# Patient Record
Sex: Female | Born: 1950
Health system: Southern US, Community
[De-identification: ages and names within clinical notes are randomized; demographics above are authoritative.]

## PROBLEM LIST (undated history)

## (undated) DIAGNOSIS — M1990 Unspecified osteoarthritis, unspecified site: Secondary | ICD-10-CM

## (undated) DIAGNOSIS — R011 Cardiac murmur, unspecified: Secondary | ICD-10-CM

## (undated) DIAGNOSIS — N200 Calculus of kidney: Secondary | ICD-10-CM

## (undated) DIAGNOSIS — I514 Myocarditis, unspecified: Secondary | ICD-10-CM

## (undated) DIAGNOSIS — K5790 Diverticulosis of intestine, part unspecified, without perforation or abscess without bleeding: Secondary | ICD-10-CM

## (undated) DIAGNOSIS — G4730 Sleep apnea, unspecified: Secondary | ICD-10-CM

## (undated) DIAGNOSIS — H269 Unspecified cataract: Secondary | ICD-10-CM

## (undated) DIAGNOSIS — Z9989 Dependence on other enabling machines and devices: Secondary | ICD-10-CM

## (undated) DIAGNOSIS — S42402A Unspecified fracture of lower end of left humerus, initial encounter for closed fracture: Secondary | ICD-10-CM

## (undated) DIAGNOSIS — I10 Essential (primary) hypertension: Secondary | ICD-10-CM

## (undated) DIAGNOSIS — R112 Nausea with vomiting, unspecified: Secondary | ICD-10-CM

## (undated) DIAGNOSIS — E119 Type 2 diabetes mellitus without complications: Secondary | ICD-10-CM

## (undated) DIAGNOSIS — G4733 Obstructive sleep apnea (adult) (pediatric): Secondary | ICD-10-CM

## (undated) DIAGNOSIS — Z9889 Other specified postprocedural states: Secondary | ICD-10-CM

## (undated) DIAGNOSIS — H3530 Unspecified macular degeneration: Secondary | ICD-10-CM

## (undated) DIAGNOSIS — E7800 Pure hypercholesterolemia, unspecified: Secondary | ICD-10-CM

## (undated) HISTORY — PX: BREAST LUMPECTOMY WITH AXILLARY LYMPH NODE BIOPSY: SHX5593

## (undated) HISTORY — PX: EXTRACORPOREAL SHOCK WAVE LITHOTRIPSY: SHX1557

## (undated) HISTORY — PX: CERVICAL POLYPECTOMY: SHX88

## (undated) HISTORY — PX: TONSILLECTOMY: SUR1361

## (undated) HISTORY — PX: EYE SURGERY: SHX253

## (undated) HISTORY — PX: JOINT REPLACEMENT: SHX530

---

## 2006-07-17 ENCOUNTER — Ambulatory Visit: Payer: Self-pay | Admitting: Gastroenterology

## 2006-08-01 ENCOUNTER — Encounter (INDEPENDENT_AMBULATORY_CARE_PROVIDER_SITE_OTHER): Payer: Self-pay | Admitting: Family Medicine

## 2006-08-01 ENCOUNTER — Encounter: Payer: Self-pay | Admitting: Gastroenterology

## 2006-08-01 ENCOUNTER — Ambulatory Visit: Payer: Self-pay | Admitting: Gastroenterology

## 2009-08-10 ENCOUNTER — Ambulatory Visit: Payer: Self-pay | Admitting: Gastroenterology

## 2009-08-10 DIAGNOSIS — K6289 Other specified diseases of anus and rectum: Secondary | ICD-10-CM

## 2009-08-10 DIAGNOSIS — K5730 Diverticulosis of large intestine without perforation or abscess without bleeding: Secondary | ICD-10-CM | POA: Insufficient documentation

## 2009-08-10 DIAGNOSIS — Z860100 Personal history of colon polyps, unspecified: Secondary | ICD-10-CM | POA: Insufficient documentation

## 2009-08-10 DIAGNOSIS — R1011 Right upper quadrant pain: Secondary | ICD-10-CM

## 2010-02-03 NOTE — Assessment & Plan Note (Signed)
Summary: rectal pain--ch.   History of Present Illness Visit Type: Initial Visit Primary GI MD: Elie Goody MD [facility] Primary Lynnmarie Lovett: Malena Peer, MD Chief Complaint: C/o lump in rt. side and in irritation around rectum History of Present Illness:   This is a 60 year old female, who relates rectal swelling and rectal pain for several weeks, which has now resolved. She also noted right flank pain that changed with position and has resolved after physical therapy for low back pain.  She states she recently had diverticulitis and was treated with a course of antibiotics. Colonoscopy in July 2008 revealed diverticulosis and a small adenomatous polyp.   GI Review of Systems    Reports abdominal pain and  bloating.      Denies acid reflux, belching, chest pain, dysphagia with liquids, dysphagia with solids, heartburn, loss of appetite, nausea, vomiting, vomiting blood, weight loss, and  weight gain.      Reports diverticulosis.     Denies anal fissure, black tarry stools, change in bowel habit, constipation, diarrhea, fecal incontinence, heme positive stool, hemorrhoids, irritable bowel syndrome, jaundice, light color stool, liver problems, rectal bleeding, and  rectal pain.   Current Medications (verified): 1)  Ambien 10 Mg Tabs (Zolpidem Tartrate) .Marland Kitchen.. 1 By Mouth At Bedtime As Needed Sleep 2)  Multivitamins  Tabs (Multiple Vitamin) .Marland Kitchen.. 1 By Mouth Once Daily 3)  Fish Oil 1000 Mg Caps (Omega-3 Fatty Acids) .... 2 By Mouth Once Daily 4)  Preservision/lutein  Caps (Multiple Vitamins-Minerals) .Marland Kitchen.. 1 By Mouth Once Daily 5)  Vitamin B-12 1000 Mcg Tabs (Cyanocobalamin) .Marland Kitchen.. 1 By Mouth Once Daily 6)  Vitamin D3 1000 Unit Tabs (Cholecalciferol) .Marland Kitchen.. 1 By Mouth Once Daily  Allergies (verified): No Known Drug Allergies  Past History:  Past Medical History: Diverticulosis/diveritculitis Adenomatous Colon Polyps 07/2006 Hyperlipidemia Glaucoma  Past Surgical History: Reviewed history  from 08/06/2009 and no changes required. Septoplasty  Family History: Reviewed history from 08/06/2009 and no changes required. No FH of Colon Cancer:  Social History: Reviewed history from 08/06/2009 and no changes required. 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  Daily Caffeine Use Marital Status: Married Children:  Occupation:  Smoking Status:  quit  Review of Systems       The patient complains of back pain.         The pertinent positives and negatives are noted as above and in the HPI. All other ROS were reviewed and were negative.   Vital Signs:  Patient profile:   60 year old female Height:      63 inches Weight:      155 pounds BMI:     27.56 Pulse rate:   68 / minute Pulse rhythm:   regular BP sitting:   148 / 84  (left arm)  Vitals Entered By: Milford Cage NCMA (August 10, 2009 10:08 AM)  Physical Exam  General:  Well developed, well nourished, no acute distress. Head:  Normocephalic and atraumatic. Eyes:  PERRLA, no icterus. Ears:  Normal auditory acuity. Mouth:  No deformity or lesions, dentition normal. Lungs:  Clear throughout to auscultation. Heart:  Regular rate and rhythm; no murmurs, rubs,  or bruits. Abdomen:  Soft, nontender and nondistended. No masses, hepatosplenomegaly or hernias noted. Normal bowel sounds. Rectal:  Hemocult negative, and small external hemorrhoids.   Extremities:  No clubbing, cyanosis, edema or deformities noted. Neurologic:  Alert and  oriented x4;  grossly normal neurologically. Psych:  Alert and cooperative. Normal mood and affect.  Impression & Recommendations:  Problem # 1:  ANAL OR RECTAL PAIN (ZOX-096.04) Her rectal pain and swelling was likely secondary to hemorrhoids. If her symptoms return she will call for further evaluation.  Problem # 2:  ABDOMINAL PAIN-RUQ (ICD-789.01) Right flank pain appears to be musculoskeletal. If symptoms return.  Consider further evaluation with imaging studies, and blood work.  Problem # 3:  PERSONAL HX COLONIC POLYPS (ICD-V12.72) Personal history of adenomatous colon polyps. Surveillance colonoscopy recommended July 2013.  Problem # 4:  DIVERTICULOSIS-COLON (ICD-562.10) Long-term high fiber diet with adequate daily water intake.  Patient Instructions: 1)  High Fiber, Low Fat  Healthy Eating Plan brochure given.  2)  Please continue current medications.  3)  Copy sent to : Malena Peer, MD 4)  The medication list was reviewed and reconciled.  All changed / newly prescribed medications were explained.  A complete medication list was provided to the patient / caregiver.

## 2011-05-04 ENCOUNTER — Encounter: Payer: Self-pay | Admitting: Gastroenterology

## 2012-01-04 HISTORY — PX: KNEE ARTHROSCOPY: SUR90

## 2012-04-13 ENCOUNTER — Encounter: Payer: Self-pay | Admitting: Gastroenterology

## 2012-06-14 ENCOUNTER — Other Ambulatory Visit: Payer: Self-pay | Admitting: Chiropractic Medicine

## 2012-06-14 DIAGNOSIS — M25562 Pain in left knee: Secondary | ICD-10-CM

## 2012-06-19 ENCOUNTER — Ambulatory Visit
Admission: RE | Admit: 2012-06-19 | Discharge: 2012-06-19 | Disposition: A | Discharge status: Home / Self Care | Payer: BC Managed Care – PPO | Source: Ambulatory Visit | Attending: Chiropractic Medicine | Admitting: Chiropractic Medicine

## 2012-06-19 DIAGNOSIS — M25562 Pain in left knee: Secondary | ICD-10-CM

## 2013-09-05 ENCOUNTER — Other Ambulatory Visit: Payer: Self-pay | Admitting: Orthopedic Surgery

## 2013-09-05 NOTE — Progress Notes (Signed)
Preoperative surgical orders have been place into the Epic hospital system for [facility] on 09/05/2013, 10:48 AM  by Mickel Crow for surgery on 09/27/2013.  Preop Total Hip - Anterior Approach orders including Experel Injecion, IV Tylenol, and IV Decadron as long as there are no contraindications to the above medications. Arlee Muslim, PA-C

## 2013-09-19 ENCOUNTER — Other Ambulatory Visit: Payer: Self-pay | Admitting: Orthopedic Surgery

## 2013-09-19 NOTE — H&P (Signed)
Sheila Ramsey DOB: August 01, 1950 Married / Language: English / Race: White Female Date of Admission:  09/27/2013 Chief Complaint:  Right Hip Pain History of Present Illness  The patient is a 63 year old female who comes in  for a preoperative history and physical. The patient is scheduled for a right total hip arthroplasty (anterior approach) to be performed by Dr. Dione Plover. Aluisio, MD at [facility] on 09-27-2013. The patient is a 63 year old female who presents with a hip problem. The patient reports right hip problems including pain, catching and stiffness symptoms that have been present for 1 year(s) (worse last 6 months). The symptoms began without any known injury but after left knee surgery last year. Symptoms reported include pain with weightbearing, stiffness and difficulty ambulatingThe symptoms are described as severe. The patient feels as if their symptoms are does feel they are worsening. Symptoms are exacerbated by walking. Current treatment includes nonsteroidal anti-inflammatory drugs (Meloxicam). Prior to being seen, the patient was previously evaluated by a colleague (Dr Jimmye Norman (chiropractor) had lumbar images). Unfortunately her right hip is hurting her all the time. It is definitely limiting what she can and cannot do. Functional limitations and pain have become progressively worse over the past year or so. She has lost a lot of motion in the hip. She is at a stage where it is having a very negative effect on her lifestyle. 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 She is having a hard time with that now because she had to do stairs to get to the classroom. She is very concerned about her ability to continue doing that and it is something that she really desires to continue. She is ready to proceed with surgery at this time. At the time of the H&P, the patient was still undergoing workup to include a cardiac stress test and ECHO. Studies are pending at this time. They  have been treated conservatively in the past for the above stated problem and despite conservative measures, they continue to have progressive pain and severe functional limitations and dysfunction. They have failed non-operative management including home exercise, medications, and injections. It is felt that they would benefit from undergoing total joint replacement. Risks and benefits of the procedure have been discussed with the patient and they elect to proceed with surgery pending surgical clearance. There are no active contraindications to surgery such as ongoing infection or rapidly progressive neurological disease.  Allergies Codeine Derivatives Vomiting.  Problem List/Past Medica Primary osteoarthritis of both hips (715.15  M16.0) High blood pressure Sleep Apnea uses CPAP Hypercholesterolemia Kidney Stone  Family History Hypertension Mother.  Social History  Marital status married No history of drug/alcohol rehab Living situation live with spouse Current work status working part time Exercise Exercises never Not under pain contract Tobacco / smoke exposure 06/20/2013: no Tobacco use Former smoker. 06/20/2013: smoke(d) less than 1/2 pack(s) per day Children 1 Current drinker 06/20/2013: Currently drinks wine and hard liquor only occasionally per week  Medication History CeleBREX (200MG  Capsule, 1 (one) Capsule Oral daily, Taken starting 06/27/2013) Active. Lialda (1.2GM Tablet DR, Oral) Active. Meloxicam (7.5MG  Tablet, Oral) Active. Triamterene-HCTZ (37.5-25MG  Capsule, Oral) Active.   Past Surgical History  Tonsillectomy Straighten Nasal Septum Arthroscopy of Knee left Colon Polyp Removal - Colonoscopy   Review of Systems  General Not Present- Chills, Fatigue, Fever, Memory Loss, Night Sweats, Weight Gain and Weight Loss. Skin Not Present- Eczema, Hives, Itching, Lesions and Rash. HEENT Not Present- Dentures, Double Vision,  Headache,  Hearing Loss, Tinnitus and Visual Loss. Respiratory Not Present- Allergies, Chronic Cough, Coughing up blood, Shortness of breath at rest and Shortness of breath with exertion. Cardiovascular Not Present- Chest Pain, Difficulty Breathing Lying Down, Murmur, Palpitations, Racing/skipping heartbeats and Swelling. Gastrointestinal Not Present- Abdominal Pain, Bloody Stool, Constipation, Diarrhea, Difficulty Swallowing, Heartburn, Jaundice, Loss of appetitie, Nausea and Vomiting. Female Genitourinary Not Present- Blood in Urine, Discharge, Flank Pain, Incontinence, Painful Urination, Urgency, Urinary frequency, Urinary Retention, Urinating at Night and Weak urinary stream. Musculoskeletal Not Present- Back Pain, Joint Pain, Joint Swelling, Morning Stiffness, Muscle Pain, Muscle Weakness and Spasms. Neurological Not Present- Blackout spells, Difficulty with balance, Dizziness, Paralysis, Tremor and Weakness. Psychiatric Not Present- Insomnia.   Vitals  Pulse: 78 (Regular)  BP: 162/88 (Sitting, Right Arm, Standard)  Physical Exam  General Mental Status -Alert, cooperative and good historian. General Appearance-pleasant, Not in acute distress. Orientation-Oriented X3. Build & Nutrition-Well nourished and Well developed.  Head and Neck Head-normocephalic, atraumatic . Neck Global Assessment - supple, no bruit auscultated on the right, no bruit auscultated on the left.  Eye Pupil - Bilateral-Regular and Round. Motion - Bilateral-EOMI.  Chest and Lung Exam Auscultation Breath sounds - clear at anterior chest wall and clear at posterior chest wall. Adventitious sounds - No Adventitious sounds.  Cardiovascular Auscultation Rhythm - Regular rate and rhythm. Heart Sounds - S1 WNL and S2 WNL. Murmurs & Other Heart Sounds: Murmur 1 - Location - Aortic Area and Pulmonic Area. Timing - Holosystolic. Grade - II/VI.  Abdomen Palpation/Percussion Tenderness - Abdomen is non-tender  to palpation. Rigidity (guarding) - Abdomen is soft. Auscultation Auscultation of the abdomen reveals - Bowel sounds normal.  Female Genitourinary Note: Not done, not pertinent to present illness   Musculoskeletal Note: On exam she is a very pleasant well developed female alert and oriented in no apparent distress. Her right hip shows flexion to 95 degrees, rotation in 10 degrees, rotation out 20 degrees, and abduction 20 degrees without discomfort. Left hip with similar range of motion without discomfort. She has minimal tenderness around the greater trochanter on the right. Her knee exam shows slight crepitus on range of motion. No joint line tenderness or instability. Pulses, sensation, and motor are intact in both lower extremities.  RADIOGRAPHS AP of pelvis and lateral of the right hip shows severe advanced end stage arthritis of the right and left hips. She has some evidence of acetabular dysplasia on both sides with sockets that are very shallow.   Assessment & Plan Primary osteoarthritis of both hips (715.15  M16.0) Note:Plan is for a Right Total Hip Replacement - Anterior Approach by Dr. Wynelle Link.  Plan is to go home.  PCP - Dr. Patric Dykes  The patient does not have any contraindications and will receive TXA (tranexamic acid) prior to surgery.  Please note that the patient does get nauseated wtih anesthesia. Patient does not want any forced air "bear hugger" warming system during the surgery.  At the time of the H&P, the patient was still undergoing workup to include a cardiac stress test and ECHO. Studies are pending at this time.  Signed electronically by Joelene Millin, III PA-C

## 2013-09-20 ENCOUNTER — Encounter (HOSPITAL_COMMUNITY): Payer: Self-pay

## 2013-09-20 ENCOUNTER — Encounter (HOSPITAL_COMMUNITY)
Admission: RE | Admit: 2013-09-20 | Discharge: 2013-09-20 | Disposition: A | Discharge status: Home / Self Care | Payer: BC Managed Care – PPO | Source: Ambulatory Visit | Attending: Orthopedic Surgery | Admitting: Orthopedic Surgery

## 2013-09-20 DIAGNOSIS — M169 Osteoarthritis of hip, unspecified: Secondary | ICD-10-CM | POA: Insufficient documentation

## 2013-09-20 DIAGNOSIS — Z01812 Encounter for preprocedural laboratory examination: Secondary | ICD-10-CM | POA: Insufficient documentation

## 2013-09-20 DIAGNOSIS — Z01810 Encounter for preprocedural cardiovascular examination: Secondary | ICD-10-CM | POA: Insufficient documentation

## 2013-09-20 DIAGNOSIS — M1610 Unilateral primary osteoarthritis, unspecified hip: Secondary | ICD-10-CM | POA: Insufficient documentation

## 2013-09-20 HISTORY — DX: Unspecified cataract: H26.9

## 2013-09-20 HISTORY — DX: Sleep apnea, unspecified: G47.30

## 2013-09-20 HISTORY — DX: Diverticulosis of intestine, part unspecified, without perforation or abscess without bleeding: K57.90

## 2013-09-20 HISTORY — DX: Unspecified osteoarthritis, unspecified site: M19.90

## 2013-09-20 HISTORY — DX: Unspecified macular degeneration: H35.30

## 2013-09-20 HISTORY — DX: Essential (primary) hypertension: I10

## 2013-09-20 HISTORY — DX: Nausea with vomiting, unspecified: R11.2

## 2013-09-20 HISTORY — DX: Other specified postprocedural states: Z98.890

## 2013-09-20 LAB — SURGICAL PCR SCREEN
MRSA, PCR: NEGATIVE
Staphylococcus aureus: NEGATIVE

## 2013-09-20 LAB — URINALYSIS, ROUTINE W REFLEX MICROSCOPIC
BILIRUBIN URINE: NEGATIVE
GLUCOSE, UA: NEGATIVE mg/dL
HGB URINE DIPSTICK: NEGATIVE
KETONES UR: NEGATIVE mg/dL
Leukocytes, UA: NEGATIVE
Nitrite: NEGATIVE
PROTEIN: NEGATIVE mg/dL
Specific Gravity, Urine: 1.02 (ref 1.005–1.030)
UROBILINOGEN UA: 0.2 mg/dL (ref 0.0–1.0)
pH: 6.5 (ref 5.0–8.0)

## 2013-09-20 LAB — TYPE AND SCREEN
ABO/RH(D): B POS
ANTIBODY SCREEN: NEGATIVE

## 2013-09-20 LAB — CBC
HEMATOCRIT: 46.9 % — AB (ref 36.0–46.0)
HEMOGLOBIN: 16 g/dL — AB (ref 12.0–15.0)
MCH: 31.8 pg (ref 26.0–34.0)
MCHC: 34.1 g/dL (ref 30.0–36.0)
MCV: 93.2 fL (ref 78.0–100.0)
Platelets: 294 10*3/uL (ref 150–400)
RBC: 5.03 MIL/uL (ref 3.87–5.11)
RDW: 12.8 % (ref 11.5–15.5)
WBC: 4.7 10*3/uL (ref 4.0–10.5)

## 2013-09-20 LAB — PROTIME-INR
INR: 1.01 (ref 0.00–1.49)
PROTHROMBIN TIME: 13.3 s (ref 11.6–15.2)

## 2013-09-20 LAB — COMPREHENSIVE METABOLIC PANEL
ALT: 20 U/L (ref 0–35)
ANION GAP: 14 (ref 5–15)
AST: 20 U/L (ref 0–37)
Albumin: 4 g/dL (ref 3.5–5.2)
Alkaline Phosphatase: 72 U/L (ref 39–117)
BUN: 12 mg/dL (ref 6–23)
CALCIUM: 9.7 mg/dL (ref 8.4–10.5)
CO2: 24 mEq/L (ref 19–32)
Chloride: 103 mEq/L (ref 96–112)
Creatinine, Ser: 0.8 mg/dL (ref 0.50–1.10)
GFR calc non Af Amer: 78 mL/min — ABNORMAL LOW (ref >90)
GLUCOSE: 130 mg/dL — AB (ref 70–99)
Potassium: 4 mEq/L (ref 3.7–5.3)
Sodium: 141 mEq/L (ref 137–147)
TOTAL PROTEIN: 7.5 g/dL (ref 6.0–8.3)
Total Bilirubin: 0.5 mg/dL (ref 0.3–1.2)

## 2013-09-20 LAB — ABO/RH: ABO/RH(D): B POS

## 2013-09-20 LAB — APTT: aPTT: 29 seconds (ref 24–37)

## 2013-09-20 MED ORDER — CHLORHEXIDINE GLUCONATE 4 % EX LIQD: 60.0000 mL | Freq: Once | CUTANEOUS | Status: DC

## 2013-09-20 NOTE — Pre-Procedure Instructions (Signed)
Sheila Ramsey  09/20/2013   Your procedure is scheduled on:  September 27, 2013 Friday @ 11:50am  Report to [facility] Admitting at 9:50 AM.  Call this number if you have problems the morning of surgery: [phone]   Remember:   Do not eat food or drink liquids after midnight September 24 Thrusday   Take these medicines the morning of surgery with A SIP OF WATER:    STOP ASPIRIN, HERBAL MEDICATIONS ONE WEEK PRIOR TO SURGERY   Do not wear jewelry, make-up or nail polish.  Do not wear lotions, powders, or perfumes. You may wear deodorant.  Do not shave 48 hours prior to surgery.   Do not bring valuables to the hospital.  [facility] is not responsible  for any belongings or valuables.               Contacts, dentures or bridgework may not be worn into surgery.  Leave suitcase in the car. After surgery it may be brought to your room.  For patients admitted to the hospital, discharge time is determined by your                treatment team.               Patients discharged the day of surgery will not be allowed to drive  home.  Name and phone number of your driver:      Please read over the following fact sheets that you were given: Pain Booklet, Coughing and Deep Breathing, Blood Transfusion Information and Surgical Site Infection Prevention

## 2013-09-20 NOTE — Progress Notes (Signed)
Client states had CXR, stress test, ekg, echo performed one week ago at [facility], we have requested those documents.

## 2013-09-26 MED ORDER — CEFAZOLIN SODIUM-DEXTROSE 2-3 GM-% IV SOLR
2.0000 g | INTRAVENOUS | Status: AC
Start: 2013-09-27 — End: 2013-09-27
  Administered 2013-09-27: 2 g via INTRAVENOUS
  Filled 2013-09-26: qty 50

## 2013-09-26 MED ORDER — DEXAMETHASONE SODIUM PHOSPHATE 10 MG/ML IJ SOLN
10.0000 mg | Freq: Once | INTRAMUSCULAR | Status: AC
  Administered 2013-09-27 (×5): 2 mg via INTRAVENOUS
  Filled 2013-09-26: qty 1

## 2013-09-26 MED ORDER — BUPIVACAINE LIPOSOME 1.3 % IJ SUSP
20.0000 mL | Freq: Once | INTRAMUSCULAR | Status: DC
  Filled 2013-09-26: qty 20

## 2013-09-26 MED ORDER — SODIUM CHLORIDE 0.9 % IV SOLN: INTRAVENOUS | Status: DC

## 2013-09-26 MED ORDER — ACETAMINOPHEN 10 MG/ML IV SOLN
1000.0000 mg | Freq: Once | INTRAVENOUS | Status: AC
  Administered 2013-09-27: 1000 mg via INTRAVENOUS
  Filled 2013-09-26: qty 100

## 2013-09-26 MED ORDER — TRANEXAMIC ACID 100 MG/ML IV SOLN
1000.0000 mg | INTRAVENOUS | Status: AC
  Administered 2013-09-27: 1000 mg via INTRAVENOUS
  Filled 2013-09-26: qty 10

## 2013-09-26 NOTE — Progress Notes (Signed)
Pt notified of time change;to arrive at 0800-verbalized understanding

## 2013-09-27 ENCOUNTER — Inpatient Hospital Stay (HOSPITAL_COMMUNITY): Payer: BC Managed Care – PPO | Admitting: Certified Registered Nurse Anesthetist

## 2013-09-27 ENCOUNTER — Encounter (HOSPITAL_COMMUNITY): Payer: Self-pay | Admitting: Certified Registered Nurse Anesthetist

## 2013-09-27 ENCOUNTER — Inpatient Hospital Stay (HOSPITAL_COMMUNITY): Payer: BC Managed Care – PPO

## 2013-09-27 ENCOUNTER — Inpatient Hospital Stay (HOSPITAL_COMMUNITY)
Admission: RE | Admit: 2013-09-27 | Discharge: 2013-09-28 | DRG: 470 | Disposition: A | Discharge status: Home Health | Payer: BC Managed Care – PPO | Source: Ambulatory Visit | Attending: Orthopedic Surgery | Admitting: Orthopedic Surgery

## 2013-09-27 ENCOUNTER — Encounter (HOSPITAL_COMMUNITY): Payer: BC Managed Care – PPO | Admitting: Certified Registered Nurse Anesthetist

## 2013-09-27 ENCOUNTER — Encounter (HOSPITAL_COMMUNITY)
Admission: RE | Disposition: A | Discharge status: Home Health | Payer: Self-pay | Source: Ambulatory Visit | Attending: Orthopedic Surgery

## 2013-09-27 DIAGNOSIS — M1610 Unilateral primary osteoarthritis, unspecified hip: Secondary | ICD-10-CM | POA: Diagnosis present

## 2013-09-27 DIAGNOSIS — M25559 Pain in unspecified hip: Secondary | ICD-10-CM | POA: Diagnosis present

## 2013-09-27 DIAGNOSIS — E7800 Pure hypercholesterolemia, unspecified: Secondary | ICD-10-CM | POA: Diagnosis present

## 2013-09-27 DIAGNOSIS — I10 Essential (primary) hypertension: Secondary | ICD-10-CM | POA: Diagnosis present

## 2013-09-27 DIAGNOSIS — Z87891 Personal history of nicotine dependence: Secondary | ICD-10-CM | POA: Diagnosis not present

## 2013-09-27 DIAGNOSIS — Z8249 Family history of ischemic heart disease and other diseases of the circulatory system: Secondary | ICD-10-CM | POA: Diagnosis not present

## 2013-09-27 DIAGNOSIS — H3530 Unspecified macular degeneration: Secondary | ICD-10-CM | POA: Diagnosis present

## 2013-09-27 DIAGNOSIS — G4730 Sleep apnea, unspecified: Secondary | ICD-10-CM | POA: Diagnosis present

## 2013-09-27 DIAGNOSIS — M169 Osteoarthritis of hip, unspecified: Secondary | ICD-10-CM | POA: Diagnosis present

## 2013-09-27 HISTORY — PX: TOTAL HIP ARTHROPLASTY: SHX124

## 2013-09-27 LAB — GLUCOSE, CAPILLARY: Glucose-Capillary: 238 mg/dL — ABNORMAL HIGH (ref 70–99)

## 2013-09-27 SURGERY — ARTHROPLASTY, HIP, TOTAL, ANTERIOR APPROACH
Anesthesia: Spinal | Site: Hip | Laterality: Right

## 2013-09-27 MED ORDER — ONDANSETRON HCL 4 MG/2ML IJ SOLN: 4.0000 mg | Freq: Four times a day (QID) | INTRAMUSCULAR | Status: DC | PRN

## 2013-09-27 MED ORDER — LIDOCAINE HCL (CARDIAC) 20 MG/ML IV SOLN
INTRAVENOUS | Status: AC
  Filled 2013-09-27: qty 5

## 2013-09-27 MED ORDER — BUPIVACAINE HCL (PF) 0.75 % IJ SOLN
INTRAMUSCULAR | Status: DC | PRN
  Administered 2013-09-27: 12 mg via INTRATHECAL

## 2013-09-27 MED ORDER — ACETAMINOPHEN 650 MG RE SUPP: 650.0000 mg | Freq: Four times a day (QID) | RECTAL | Status: DC | PRN

## 2013-09-27 MED ORDER — 0.9 % SODIUM CHLORIDE (POUR BTL) OPTIME
TOPICAL | Status: DC | PRN
  Administered 2013-09-27: 1000 mL

## 2013-09-27 MED ORDER — BUPIVACAINE LIPOSOME 1.3 % IJ SUSP
INTRAMUSCULAR | Status: DC | PRN
  Administered 2013-09-27: 20 mL

## 2013-09-27 MED ORDER — PHENOL 1.4 % MT LIQD: 1.0000 | OROMUCOSAL | Status: DC | PRN

## 2013-09-27 MED ORDER — METOCLOPRAMIDE HCL 5 MG/ML IJ SOLN: 5.0000 mg | Freq: Three times a day (TID) | INTRAMUSCULAR | Status: DC | PRN

## 2013-09-27 MED ORDER — METHOCARBAMOL 1000 MG/10ML IJ SOLN
500.0000 mg | Freq: Four times a day (QID) | INTRAMUSCULAR | Status: DC | PRN
  Filled 2013-09-27: qty 5

## 2013-09-27 MED ORDER — ONDANSETRON HCL 4 MG/2ML IJ SOLN
INTRAMUSCULAR | Status: DC | PRN
  Administered 2013-09-27: 4 mg via INTRAVENOUS

## 2013-09-27 MED ORDER — FENTANYL CITRATE 0.05 MG/ML IJ SOLN
INTRAMUSCULAR | Status: AC
  Filled 2013-09-27: qty 5

## 2013-09-27 MED ORDER — OXYCODONE HCL 5 MG PO TABS: 5.0000 mg | ORAL_TABLET | Freq: Once | ORAL | Status: DC | PRN

## 2013-09-27 MED ORDER — POTASSIUM CHLORIDE IN NACL 20-0.9 MEQ/L-% IV SOLN
INTRAVENOUS | Status: DC
  Administered 2013-09-28: 01:00:00 via INTRAVENOUS
  Filled 2013-09-27 (×3): qty 1000

## 2013-09-27 MED ORDER — MENTHOL 3 MG MT LOZG: 1.0000 | LOZENGE | OROMUCOSAL | Status: DC | PRN

## 2013-09-27 MED ORDER — ARTIFICIAL TEARS OP OINT
TOPICAL_OINTMENT | OPHTHALMIC | Status: AC
  Filled 2013-09-27: qty 3.5

## 2013-09-27 MED ORDER — MIDAZOLAM HCL 2 MG/2ML IJ SOLN
INTRAMUSCULAR | Status: AC
  Filled 2013-09-27: qty 2

## 2013-09-27 MED ORDER — BUPIVACAINE HCL (PF) 0.25 % IJ SOLN
INTRAMUSCULAR | Status: AC
  Filled 2013-09-27: qty 30

## 2013-09-27 MED ORDER — BISACODYL 10 MG RE SUPP: 10.0000 mg | Freq: Every day | RECTAL | Status: DC | PRN

## 2013-09-27 MED ORDER — ONDANSETRON HCL 4 MG/2ML IJ SOLN
INTRAMUSCULAR | Status: AC
  Filled 2013-09-27: qty 2

## 2013-09-27 MED ORDER — BUPIVACAINE HCL (PF) 0.25 % IJ SOLN
INTRAMUSCULAR | Status: DC | PRN
  Administered 2013-09-27: 30 mL

## 2013-09-27 MED ORDER — ZOLPIDEM TARTRATE 5 MG PO TABS
5.0000 mg | ORAL_TABLET | Freq: Every evening | ORAL | Status: DC | PRN
  Administered 2013-09-27: 5 mg via ORAL
  Filled 2013-09-27 (×2): qty 1

## 2013-09-27 MED ORDER — ROCURONIUM BROMIDE 50 MG/5ML IV SOLN
INTRAVENOUS | Status: AC
  Filled 2013-09-27: qty 1

## 2013-09-27 MED ORDER — HYDROMORPHONE HCL 1 MG/ML IJ SOLN
0.5000 mg | INTRAMUSCULAR | Status: DC | PRN
  Filled 2013-09-27: qty 1

## 2013-09-27 MED ORDER — PROPOFOL INFUSION 10 MG/ML OPTIME
INTRAVENOUS | Status: DC | PRN
  Administered 2013-09-27: 50 ug/kg/min via INTRAVENOUS

## 2013-09-27 MED ORDER — LORAZEPAM 0.5 MG PO TABS
0.5000 mg | ORAL_TABLET | Freq: Three times a day (TID) | ORAL | Status: DC
  Filled 2013-09-27 (×2): qty 1

## 2013-09-27 MED ORDER — HYDROMORPHONE HCL 2 MG PO TABS
2.0000 mg | ORAL_TABLET | ORAL | Status: DC | PRN
  Administered 2013-09-28: 2 mg via ORAL
  Filled 2013-09-27: qty 1

## 2013-09-27 MED ORDER — SODIUM CHLORIDE 0.9 % IJ SOLN
INTRAMUSCULAR | Status: DC | PRN
  Administered 2013-09-27: 30 mL via INTRAVENOUS

## 2013-09-27 MED ORDER — KETOROLAC TROMETHAMINE 15 MG/ML IJ SOLN
INTRAMUSCULAR | Status: AC
  Filled 2013-09-27: qty 1

## 2013-09-27 MED ORDER — OXYCODONE HCL 5 MG/5ML PO SOLN: 5.0000 mg | Freq: Once | ORAL | Status: DC | PRN

## 2013-09-27 MED ORDER — ACETAMINOPHEN 325 MG PO TABS: 650.0000 mg | ORAL_TABLET | Freq: Four times a day (QID) | ORAL | Status: DC | PRN

## 2013-09-27 MED ORDER — RIVAROXABAN 10 MG PO TABS
10.0000 mg | ORAL_TABLET | Freq: Every day | ORAL | Status: DC
  Administered 2013-09-28: 10 mg via ORAL
  Filled 2013-09-27 (×2): qty 1

## 2013-09-27 MED ORDER — SCOPOLAMINE 1 MG/3DAYS TD PT72
MEDICATED_PATCH | TRANSDERMAL | Status: AC
  Filled 2013-09-27: qty 1

## 2013-09-27 MED ORDER — KETOROLAC TROMETHAMINE 15 MG/ML IJ SOLN
7.5000 mg | Freq: Four times a day (QID) | INTRAMUSCULAR | Status: AC | PRN
  Administered 2013-09-27: 7.5 mg via INTRAVENOUS

## 2013-09-27 MED ORDER — ACETAMINOPHEN 500 MG PO TABS
1000.0000 mg | ORAL_TABLET | Freq: Four times a day (QID) | ORAL | Status: DC
  Administered 2013-09-27 – 2013-09-28 (×3): 1000 mg via ORAL
  Filled 2013-09-27 (×4): qty 2

## 2013-09-27 MED ORDER — TRAMADOL HCL 50 MG PO TABS
50.0000 mg | ORAL_TABLET | Freq: Four times a day (QID) | ORAL | Status: DC | PRN
  Administered 2013-09-27: 50 mg via ORAL
  Administered 2013-09-28: 100 mg via ORAL
  Filled 2013-09-27 (×2): qty 2
  Filled 2013-09-27: qty 1

## 2013-09-27 MED ORDER — CEFAZOLIN SODIUM-DEXTROSE 2-3 GM-% IV SOLR
2.0000 g | Freq: Four times a day (QID) | INTRAVENOUS | Status: AC
  Administered 2013-09-27 – 2013-09-28 (×2): 2 g via INTRAVENOUS
  Filled 2013-09-27 (×3): qty 50

## 2013-09-27 MED ORDER — LIDOCAINE HCL (CARDIAC) 20 MG/ML IV SOLN
INTRAVENOUS | Status: DC | PRN
  Administered 2013-09-27: 40 mg via INTRAVENOUS

## 2013-09-27 MED ORDER — DIPHENHYDRAMINE HCL 12.5 MG/5ML PO ELIX
12.5000 mg | ORAL_SOLUTION | ORAL | Status: DC | PRN
Start: 2013-09-27 — End: 2013-09-28

## 2013-09-27 MED ORDER — SCOPOLAMINE 1 MG/3DAYS TD PT72
1.0000 | MEDICATED_PATCH | TRANSDERMAL | Status: DC
  Administered 2013-09-27: 1.5 mg via TRANSDERMAL
  Filled 2013-09-27: qty 1

## 2013-09-27 MED ORDER — DIPHENHYDRAMINE HCL 50 MG/ML IJ SOLN
INTRAMUSCULAR | Status: DC | PRN
  Administered 2013-09-27 (×2): 12.5 mg via INTRAVENOUS

## 2013-09-27 MED ORDER — DOCUSATE SODIUM 100 MG PO CAPS
100.0000 mg | ORAL_CAPSULE | Freq: Two times a day (BID) | ORAL | Status: DC
  Administered 2013-09-27 – 2013-09-28 (×2): 100 mg via ORAL
  Filled 2013-09-27 (×3): qty 1

## 2013-09-27 MED ORDER — HYDROMORPHONE HCL 1 MG/ML IJ SOLN: 0.2500 mg | INTRAMUSCULAR | Status: DC | PRN

## 2013-09-27 MED ORDER — MIDAZOLAM HCL 5 MG/5ML IJ SOLN
INTRAMUSCULAR | Status: DC | PRN
  Administered 2013-09-27 (×2): 2 mg via INTRAVENOUS

## 2013-09-27 MED ORDER — FENTANYL CITRATE 0.05 MG/ML IJ SOLN
INTRAMUSCULAR | Status: DC | PRN
  Administered 2013-09-27 (×3): 50 ug via INTRAVENOUS

## 2013-09-27 MED ORDER — DEXAMETHASONE SODIUM PHOSPHATE 10 MG/ML IJ SOLN
10.0000 mg | Freq: Once | INTRAMUSCULAR | Status: AC
  Administered 2013-09-28: 10 mg via INTRAVENOUS
  Filled 2013-09-27: qty 1

## 2013-09-27 MED ORDER — DIPHENHYDRAMINE HCL 50 MG/ML IJ SOLN
INTRAMUSCULAR | Status: AC
  Filled 2013-09-27: qty 1

## 2013-09-27 MED ORDER — METHOCARBAMOL 500 MG PO TABS
500.0000 mg | ORAL_TABLET | Freq: Four times a day (QID) | ORAL | Status: DC | PRN
Start: 2013-09-27 — End: 2013-09-28
  Administered 2013-09-27: 500 mg via ORAL
  Filled 2013-09-27 (×2): qty 1

## 2013-09-27 MED ORDER — ONDANSETRON HCL 4 MG PO TABS: 4.0000 mg | ORAL_TABLET | Freq: Four times a day (QID) | ORAL | Status: DC | PRN

## 2013-09-27 MED ORDER — METOCLOPRAMIDE HCL 10 MG PO TABS: 5.0000 mg | ORAL_TABLET | Freq: Three times a day (TID) | ORAL | Status: DC | PRN

## 2013-09-27 MED ORDER — FLEET ENEMA 7-19 GM/118ML RE ENEM: 1.0000 | ENEMA | Freq: Once | RECTAL | Status: AC | PRN

## 2013-09-27 MED ORDER — POLYETHYLENE GLYCOL 3350 17 G PO PACK: 17.0000 g | PACK | Freq: Every day | ORAL | Status: DC | PRN

## 2013-09-27 MED ORDER — PROMETHAZINE HCL 25 MG/ML IJ SOLN: 6.2500 mg | INTRAMUSCULAR | Status: DC | PRN

## 2013-09-27 MED ORDER — PROPOFOL 10 MG/ML IV BOLUS
INTRAVENOUS | Status: AC
  Filled 2013-09-27: qty 20

## 2013-09-27 MED ORDER — LACTATED RINGERS IV SOLN
INTRAVENOUS | Status: DC
  Administered 2013-09-27 (×2): via INTRAVENOUS

## 2013-09-27 SURGICAL SUPPLY — 47 items
BLADE SAW SGTL 18X1.27X75 (BLADE) ×2 IMPLANT
BLADE SAW SGTL 18X1.27X75MM (BLADE) ×1
CAPT HIP PF COP ×3 IMPLANT
CLOSURE STERI-STRIP 1/2X4 (GAUZE/BANDAGES/DRESSINGS) ×1
CLSR STERI-STRIP ANTIMIC 1/2X4 (GAUZE/BANDAGES/DRESSINGS) ×2 IMPLANT
DECANTER SPIKE VIAL GLASS SM (MISCELLANEOUS) IMPLANT
DRAPE C-ARM 42X72 X-RAY (DRAPES) ×3 IMPLANT
DRAPE STERI IOBAN 125X83 (DRAPES) ×3 IMPLANT
DRAPE U-SHAPE 47X51 STRL (DRAPES) ×9 IMPLANT
DRSG MEPILEX BORDER 4X4 (GAUZE/BANDAGES/DRESSINGS) ×3 IMPLANT
DRSG MEPILEX BORDER 4X8 (GAUZE/BANDAGES/DRESSINGS) ×3 IMPLANT
DURAPREP 26ML APPLICATOR (WOUND CARE) ×3 IMPLANT
ELECT BLADE 6.5 EXT (BLADE) ×3 IMPLANT
ELECT REM PT RETURN 9FT ADLT (ELECTROSURGICAL) ×3
ELECTRODE REM PT RTRN 9FT ADLT (ELECTROSURGICAL) ×1 IMPLANT
EVACUATOR 1/8 PVC DRAIN (DRAIN) ×3 IMPLANT
FACESHIELD WRAPAROUND (MASK) ×6 IMPLANT
GLOVE BIO SURGEON STRL SZ 6.5 (GLOVE) ×2 IMPLANT
GLOVE BIO SURGEON STRL SZ7.5 (GLOVE) ×3 IMPLANT
GLOVE BIO SURGEON STRL SZ8 (GLOVE) ×6 IMPLANT
GLOVE BIO SURGEON STRL SZ8.5 (GLOVE) ×3 IMPLANT
GLOVE BIO SURGEONS STRL SZ 6.5 (GLOVE) ×1
GLOVE BIOGEL PI IND STRL 8 (GLOVE) ×2 IMPLANT
GLOVE BIOGEL PI INDICATOR 8 (GLOVE) ×4
GLOVE BIOGEL PI ORTHO PRO SZ8 (GLOVE) ×2
GLOVE ECLIPSE 6.5 STRL STRAW (GLOVE) ×3 IMPLANT
GLOVE ECLIPSE 8.0 STRL XLNG CF (GLOVE) ×3 IMPLANT
GLOVE PI ORTHO PRO STRL SZ8 (GLOVE) ×1 IMPLANT
GOWN STRL REUS W/ TWL LRG LVL3 (GOWN DISPOSABLE) ×1 IMPLANT
GOWN STRL REUS W/ TWL XL LVL3 (GOWN DISPOSABLE) ×1 IMPLANT
GOWN STRL REUS W/TWL LRG LVL3 (GOWN DISPOSABLE) ×2
GOWN STRL REUS W/TWL XL LVL3 (GOWN DISPOSABLE) ×2
KIT BASIN OR (CUSTOM PROCEDURE TRAY) ×3 IMPLANT
NDL SAFETY ECLIPSE 18X1.5 (NEEDLE) ×1 IMPLANT
NEEDLE HYPO 18GX1.5 SHARP (NEEDLE) ×2
PACK TOTAL JOINT (CUSTOM PROCEDURE TRAY) ×3 IMPLANT
SUT ETHIBOND NAB CT1 #1 30IN (SUTURE) ×3 IMPLANT
SUT MNCRL AB 4-0 PS2 18 (SUTURE) ×3 IMPLANT
SUT VIC AB 1 CT1 27 (SUTURE) ×2
SUT VIC AB 1 CT1 27XBRD ANTBC (SUTURE) ×1 IMPLANT
SUT VIC AB 2-0 CT1 27 (SUTURE) ×4
SUT VIC AB 2-0 CT1 TAPERPNT 27 (SUTURE) ×2 IMPLANT
SUT VLOC 180 0 24IN GS25 (SUTURE) ×3 IMPLANT
SYR 20CC LL (SYRINGE) ×3 IMPLANT
SYR 50ML LL SCALE MARK (SYRINGE) ×3 IMPLANT
TOWEL OR 17X26 10 PK STRL BLUE (TOWEL DISPOSABLE) ×6 IMPLANT
TRAY FOLEY CATH 16FR SILVER (SET/KITS/TRAYS/PACK) ×3 IMPLANT

## 2013-09-27 NOTE — Interval H&P Note (Signed)
History and Physical Interval Note:  09/27/2013 9:05 AM  Sheila Ramsey  has presented today for surgery, with the diagnosis of OA RIGHT HIP  The various methods of treatment have been discussed with the patient and family. After consideration of risks, benefits and other options for treatment, the patient has consented to  Procedure(s): RIGHT TOTAL HIP ARTHROPLASTY ANTERIOR APPROACH (Right) as a surgical intervention .  The patient's history has been reviewed, patient examined, no change in status, stable for surgery.  I have reviewed the patient's chart and labs.  Questions were answered to the patient's satisfaction.     Gearlean Alf

## 2013-09-27 NOTE — Anesthesia Postprocedure Evaluation (Signed)
  Anesthesia Post-op Note  Patient: Sheila Ramsey  Procedure(s) Performed: Procedure(s): RIGHT TOTAL HIP ARTHROPLASTY ANTERIOR APPROACH (Right)  Patient Location: PACU  Anesthesia Type:Spinal  Level of Consciousness: awake and alert   Airway and Oxygen Therapy: Patient Spontanous Breathing  Post-op Pain: none  Post-op Assessment: Post-op Vital signs reviewed  Post-op Vital Signs: stable  Last Vitals:  Filed Vitals:   09/27/13 1315  BP: 167/83  Pulse: 82  Temp:   Resp: 16    Complications: No apparent anesthesia complications

## 2013-09-27 NOTE — H&P (View-Only) (Signed)
Sheila Ramsey DOB: February 01, 1950 Married / Language: English / Race: White Female Date of Admission:  09/27/2013 Chief Complaint:  Right Hip Pain History of Present Illness  The patient is a 63 year old female who comes in  for a preoperative history and physical. The patient is scheduled for a right total hip arthroplasty (anterior approach) to be performed by Dr. Dione Plover. Aluisio, MD at [facility] on 09-27-2013. The patient is a 63 year old female who presents with a hip problem. The patient reports right hip problems including pain, catching and stiffness symptoms that have been present for 1 year(s) (worse last 6 months). The symptoms began without any known injury but after left knee surgery last year. Symptoms reported include pain with weightbearing, stiffness and difficulty ambulatingThe symptoms are described as severe. The patient feels as if their symptoms are does feel they are worsening. Symptoms are exacerbated by walking. Current treatment includes nonsteroidal anti-inflammatory drugs (Meloxicam). Prior to being seen, the patient was previously evaluated by a colleague (Dr Jimmye Norman (chiropractor) had lumbar images). Unfortunately her right hip is hurting her all the time. It is definitely limiting what she can and cannot do. Functional limitations and pain have become progressively worse over the past year or so. She has lost a lot of motion in the hip. She is at a stage where it is having a very negative effect on her lifestyle. 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 She is having a hard time with that now because she had to do stairs to get to the classroom. She is very concerned about her ability to continue doing that and it is something that she really desires to continue. She is ready to proceed with surgery at this time. At the time of the H&P, the patient was still undergoing workup to include a cardiac stress test and ECHO. Studies are pending at this time. They  have been treated conservatively in the past for the above stated problem and despite conservative measures, they continue to have progressive pain and severe functional limitations and dysfunction. They have failed non-operative management including home exercise, medications, and injections. It is felt that they would benefit from undergoing total joint replacement. Risks and benefits of the procedure have been discussed with the patient and they elect to proceed with surgery pending surgical clearance. There are no active contraindications to surgery such as ongoing infection or rapidly progressive neurological disease.  Allergies Codeine Derivatives Vomiting.  Problem List/Past Medica Primary osteoarthritis of both hips (715.15  M16.0) High blood pressure Sleep Apnea uses CPAP Hypercholesterolemia Kidney Stone  Family History Hypertension Mother.  Social History  Marital status married No history of drug/alcohol rehab Living situation live with spouse Current work status working part time Exercise Exercises never Not under pain contract Tobacco / smoke exposure 06/20/2013: no Tobacco use Former smoker. 06/20/2013: smoke(d) less than 1/2 pack(s) per day Children 1 Current drinker 06/20/2013: Currently drinks wine and hard liquor only occasionally per week  Medication History CeleBREX (200MG  Capsule, 1 (one) Capsule Oral daily, Taken starting 06/27/2013) Active. Lialda (1.2GM Tablet DR, Oral) Active. Meloxicam (7.5MG  Tablet, Oral) Active. Triamterene-HCTZ (37.5-25MG  Capsule, Oral) Active.   Past Surgical History  Tonsillectomy Straighten Nasal Septum Arthroscopy of Knee left Colon Polyp Removal - Colonoscopy   Review of Systems  General Not Present- Chills, Fatigue, Fever, Memory Loss, Night Sweats, Weight Gain and Weight Loss. Skin Not Present- Eczema, Hives, Itching, Lesions and Rash. HEENT Not Present- Dentures, Double Vision,  Headache,  Hearing Loss, Tinnitus and Visual Loss. Respiratory Not Present- Allergies, Chronic Cough, Coughing up blood, Shortness of breath at rest and Shortness of breath with exertion. Cardiovascular Not Present- Chest Pain, Difficulty Breathing Lying Down, Murmur, Palpitations, Racing/skipping heartbeats and Swelling. Gastrointestinal Not Present- Abdominal Pain, Bloody Stool, Constipation, Diarrhea, Difficulty Swallowing, Heartburn, Jaundice, Loss of appetitie, Nausea and Vomiting. Female Genitourinary Not Present- Blood in Urine, Discharge, Flank Pain, Incontinence, Painful Urination, Urgency, Urinary frequency, Urinary Retention, Urinating at Night and Weak urinary stream. Musculoskeletal Not Present- Back Pain, Joint Pain, Joint Swelling, Morning Stiffness, Muscle Pain, Muscle Weakness and Spasms. Neurological Not Present- Blackout spells, Difficulty with balance, Dizziness, Paralysis, Tremor and Weakness. Psychiatric Not Present- Insomnia.   Vitals  Pulse: 78 (Regular)  BP: 162/88 (Sitting, Right Arm, Standard)  Physical Exam  General Mental Status -Alert, cooperative and good historian. General Appearance-pleasant, Not in acute distress. Orientation-Oriented X3. Build & Nutrition-Well nourished and Well developed.  Head and Neck Head-normocephalic, atraumatic . Neck Global Assessment - supple, no bruit auscultated on the right, no bruit auscultated on the left.  Eye Pupil - Bilateral-Regular and Round. Motion - Bilateral-EOMI.  Chest and Lung Exam Auscultation Breath sounds - clear at anterior chest wall and clear at posterior chest wall. Adventitious sounds - No Adventitious sounds.  Cardiovascular Auscultation Rhythm - Regular rate and rhythm. Heart Sounds - S1 WNL and S2 WNL. Murmurs & Other Heart Sounds: Murmur 1 - Location - Aortic Area and Pulmonic Area. Timing - Holosystolic. Grade - II/VI.  Abdomen Palpation/Percussion Tenderness - Abdomen is non-tender  to palpation. Rigidity (guarding) - Abdomen is soft. Auscultation Auscultation of the abdomen reveals - Bowel sounds normal.  Female Genitourinary Note: Not done, not pertinent to present illness   Musculoskeletal Note: On exam she is a very pleasant well developed female alert and oriented in no apparent distress. Her right hip shows flexion to 95 degrees, rotation in 10 degrees, rotation out 20 degrees, and abduction 20 degrees without discomfort. Left hip with similar range of motion without discomfort. She has minimal tenderness around the greater trochanter on the right. Her knee exam shows slight crepitus on range of motion. No joint line tenderness or instability. Pulses, sensation, and motor are intact in both lower extremities.  RADIOGRAPHS AP of pelvis and lateral of the right hip shows severe advanced end stage arthritis of the right and left hips. She has some evidence of acetabular dysplasia on both sides with sockets that are very shallow.   Assessment & Plan Primary osteoarthritis of both hips (715.15  M16.0) Note:Plan is for a Right Total Hip Replacement - Anterior Approach by Dr. Wynelle Link.  Plan is to go home.  PCP - Dr. Patric Dykes  The patient does not have any contraindications and will receive TXA (tranexamic acid) prior to surgery.  Please note that the patient does get nauseated wtih anesthesia. Patient does not want any forced air "bear hugger" warming system during the surgery.  At the time of the H&P, the patient was still undergoing workup to include a cardiac stress test and ECHO. Studies are pending at this time.  Signed electronically by Joelene Millin, III PA-C

## 2013-09-27 NOTE — Discharge Instructions (Signed)
°Dr. Domonique Cothran °Total Joint Specialist °[facility] °[address] °[place], [place] [address] °[phone] ° ° ° °ANTERIOR APPROACH TOTAL HIP REPLACEMENT POSTOPERATIVE DIRECTIONS ° ° °Hip Rehabilitation, Guidelines Following Surgery  °The results of a hip operation are greatly improved after range of motion and muscle strengthening exercises. Follow all safety measures which are given to protect your hip. If any of these exercises cause increased pain or swelling in your joint, decrease the amount until you are comfortable again. Then slowly increase the exercises. Call your caregiver if you have problems or questions.  °HOME CARE INSTRUCTIONS  °Most of the following instructions are designed to prevent the dislocation of your new hip.  °Remove items at home which could result in a fall. This includes throw rugs or furniture in walking pathways.  °Continue medications as instructed at time of discharge. °· You may have some home medications which will be placed on hold until you complete the course of blood thinner medication. °· You may start showering once you are discharged home but do not submerge the incision under water. Just pat the incision dry and apply a dry gauze dressing on daily. °Do not put on socks or shoes without following the instructions of your caregivers.  °Sit on high chairs which makes it easier to stand.  °Sit on chairs with arms. Use the chair arms to help push yourself up when arising.  °Keep your leg on the side of the operation out in front of you when standing up.  °Arrange for the use of a toilet seat elevator so you are not sitting low.   °· Walk with walker as instructed.  °You may resume a sexual relationship in one month or when given the OK by your caregiver.  °Use walker as long as suggested by your caregivers.  °You may put full weight on your legs and walk as much as is comfortable. °Avoid periods of inactivity such as sitting longer than an hour  when not asleep. This helps prevent blood clots.  °You may return to work once you are cleared by your surgeon.  °Do not drive a car for 6 weeks or until released by your surgeon.  °Do not drive while taking narcotics.  °Wear elastic stockings for three weeks following surgery during the day but you may remove then at night.  °Make sure you keep all of your appointments after your operation with all of your doctors and caregivers. You should call the office at the above phone number and make an appointment for approximately two weeks after the date of your surgery. °Change the dressing daily and reapply a dry dressing each time. °Please pick up a stool softener and laxative for home use as long as you are requiring pain medications. °· Continue to use ice on the hip for pain and swelling from surgery. You may notice swelling that will progress down to the foot and ankle.  This is normal after  surgery.  Elevate the leg when you are not up walking on it.   °It is important for you to complete the blood thinner medication as prescribed by your doctor. °· Continue to use the breathing machine which will help keep your temperature down.  It is common for your temperature to cycle up and down following surgery, especially at night when you are not up moving around and exerting yourself.  The breathing machine keeps your lungs expanded and your temperature down. ° °RANGE OF MOTION AND STRENGTHENING EXERCISES  °  These exercises are designed to help you keep full movement of your hip joint. Follow your caregiver's or physical therapist's instructions. Perform all exercises about fifteen times, three times per day or as directed. Exercise both hips, even if you have had only one joint replacement. These exercises can be done on a training (exercise) mat, on the floor, on a table or on a bed. Use whatever works the best and is most comfortable for you. Use music or television while you are exercising so that the exercises are  a pleasant break in your day. This will make your life better with the exercises acting as a break in routine you can look forward to.  °Lying on your back, slowly slide your foot toward your buttocks, raising your knee up off the floor. Then slowly slide your foot back down until your leg is straight again.  °Lying on your back spread your legs as far apart as you can without causing discomfort.  °Lying on your side, raise your upper leg and foot straight up from the floor as far as is comfortable. Slowly lower the leg and repeat.  °Lying on your back, tighten up the muscle in the front of your thigh (quadriceps muscles). You can do this by keeping your leg straight and trying to raise your heel off the floor. This helps strengthen the largest muscle supporting your knee.  °Lying on your back, tighten up the muscles of your buttocks both with the legs straight and with the knee bent at a comfortable angle while keeping your heel on the floor.  ° °SKILLED REHAB INSTRUCTIONS: °If the patient is transferred to a skilled rehab facility following release from the hospital, a list of the current medications will be sent to the facility for the patient to continue.  When discharged from the skilled rehab facility, please have the facility set up the patient's Home Health Physical Therapy prior to being released. Also, the skilled facility will be responsible for providing the patient with their medications at time of release from the facility to include their pain medication, the muscle relaxants, and their blood thinner medication. If the patient is still at the rehab facility at time of the two week follow up appointment, the skilled rehab facility will also need to assist the patient in arranging follow up appointment in our office and any transportation needs. ° °MAKE SURE YOU:  °Understand these instructions.  °Will watch your condition.  °Will get help right away if you are not doing well or get worse. ° °Pick up  stool softner and laxative for home. °Do not submerge incision under water. °May shower. °Continue to use ice for pain and swelling from surgery. °Total Hip Protocol. ° ° °

## 2013-09-27 NOTE — Anesthesia Procedure Notes (Signed)
Spinal  Patient location during procedure: OR Start time: 09/27/2013 10:05 AM End time: 09/27/2013 10:10 AM Staffing Anesthesiologist: Kolbi Tofte Performed by: anesthesiologist  Preanesthetic Checklist Completed: patient identified, site marked, surgical consent, pre-op evaluation, timeout performed, IV checked, risks and benefits discussed and monitors and equipment checked Spinal Block Patient position: sitting Prep: Betadine Approach: midline Location: L3-4 Injection technique: single-shot Needle Needle type: Tuohy  Needle gauge: 25 G Needle length: 9 cm Needle insertion depth: 3 cm Catheter at skin depth: 3 cm Assessment Sensory level: T6 Additional Notes Tolerated well

## 2013-09-27 NOTE — Transfer of Care (Signed)
Immediate Anesthesia Transfer of Care Note  Patient: Sheila Ramsey  Procedure(s) Performed: Procedure(s): RIGHT TOTAL HIP ARTHROPLASTY ANTERIOR APPROACH (Right)  Patient Location: PACU  Anesthesia Type:Spinal  Level of Consciousness: awake, alert  and oriented  Airway & Oxygen Therapy: Patient Spontanous Breathing  Post-op Assessment: Report given to PACU RN and Post -op Vital signs reviewed and stable  Post vital signs: Reviewed and stable  Complications: No apparent anesthesia complications

## 2013-09-27 NOTE — Anesthesia Preprocedure Evaluation (Addendum)
Anesthesia Evaluation  Patient identified by MRN, date of birth, ID band Patient awake    History of Anesthesia Complications (+) PONV  Airway Mallampati: II  Neck ROM: Full    Dental  (+) Teeth Intact   Pulmonary sleep apnea ,  breath sounds clear to auscultation        Cardiovascular hypertension, Rhythm:Regular Rate:Normal     Neuro/Psych    GI/Hepatic   Endo/Other  Morbid obesity  Renal/GU      Musculoskeletal  (+) Arthritis -,   Abdominal   Peds  Hematology   Anesthesia Other Findings   Reproductive/Obstetrics                          Anesthesia Physical Anesthesia Plan  ASA: II  Anesthesia Plan: Spinal   Post-op Pain Management:    Induction: Intravenous  Airway Management Planned: Natural Airway and Simple Face Mask  Additional Equipment:   Intra-op Plan:   Post-operative Plan:   Informed Consent: I have reviewed the patients History and Physical, chart, labs and discussed the procedure including the risks, benefits and alternatives for the proposed anesthesia with the patient or authorized representative who has indicated his/her understanding and acceptance.     Plan Discussed with: CRNA and Surgeon  Anesthesia Plan Comments:        Anesthesia Quick Evaluation

## 2013-09-27 NOTE — Progress Notes (Signed)
Utilization review completed.  

## 2013-09-27 NOTE — Evaluation (Signed)
Physical Therapy Evaluation Patient Details Name: Sheila Ramsey MRN: [id number] DOB: Aug 29, 1950 Today's Date: 09/27/2013   History of Present Illness  63 y.o. female admitted to [facility] on 09/27/13 for elective R direct anterior THA.  Pt with significant PMHx of HTN, knee arthroscopy, and L hip arthritis that will require hip replacement in a few months.    Clinical Impression  Pt is POD #0 and is moving well with min assist and RW.  She will likely progress well enough to go home with [facility] therapy f/u at discharge.  She already has delivered or is planning on borrowing all DME needed at this time.   PT to follow acutely for deficits listed below.       Follow Up Recommendations Home health PT;Supervision for mobility/OOB    Equipment Recommendations  None recommended by PT;Other (comment) (pt had RW delivered already and is borrowing a 3-in-1)    Recommendations for Other Services   NA     Precautions / Restrictions Precautions Precautions: Fall Precaution Comments: pt is quick to move and stood before therapist was ready- which she found out really hurt without upper extremity support.  Restrictions Weight Bearing Restrictions: Yes RLE Weight Bearing: Weight bearing as tolerated      Mobility  Bed Mobility Overal bed mobility: Needs Assistance Bed Mobility: Supine to Sit     Supine to sit: Min assist     General bed mobility comments: Min assist to help pt progress her right leg over EOb.   Transfers Overall transfer level: Needs assistance Equipment used: Rolling walker (2 wheeled) Transfers: Sit to/from Stand Sit to Stand: Min assist         General transfer comment: Min assist to support her trunk to get to standing with RW at EOB.    Ambulation/Gait Ambulation/Gait assistance: Min assist Ambulation Distance (Feet): 5 Feet Assistive device: Rolling walker (2 wheeled) Gait Pattern/deviations: Step-to pattern;Antalgic Gait velocity: decreased Gait velocity  interpretation: Below normal speed for age/gender General Gait Details: Pt with normal antalgic gait pattern.  She was able to [facility] her leg with her arms.  Verbal cues for leg sequencing.          Balance Overall balance assessment: Needs assistance Sitting-balance support: Feet supported;No upper extremity supported Sitting balance-Leahy Scale: Good     Standing balance support: Bilateral upper extremity supported;No upper extremity supported Standing balance-Leahy Scale: Fair Standing balance comment: statically she can stand witout upper extremity support.                               Pertinent Vitals/Pain Pain Assessment: 0-10 Pain Score: 1  Pain Location: back    Home Living Family/patient expects to be discharged to:: Private residence Living Arrangements: Spouse/significant other;Other (Comment) (home in the AM) Available Help at Discharge: Family;Available PRN/intermittently Type of Home: House Home Access: Level entry (can walk into a room she can stay in)     Home Layout: Two level;Able to live on main level with bedroom/bathroom Home Equipment: Kasandra Knudsen - single point;Walker - 4 wheels (RW already delivered, and she is planning on borrowing [facility])      Prior Function Level of Independence: Independent with assistive device(s)         Comments: drives, works as a Scientist, physiological   Dominant Hand: Right    Extremity/Trunk Assessment   Upper Extremity Assessment: Defer to OT evaluation  Lower Extremity Assessment: RLE deficits/detail;LLE deficits/detail RLE Deficits / Details: right leg with normal post op pain and strength.  Ankle 4/5, knee 3+/5, hip 2/5 LLE Deficits / Details: per pt her left hip is bad too.  She plans to have it replaced in a few months.      Communication   Communication: No difficulties  Cognition Arousal/Alertness: Awake/alert Behavior During Therapy: WFL for tasks assessed/performed Overall  Cognitive Status: Within Functional Limits for tasks assessed                         Exercises Total Joint Exercises Ankle Circles/Pumps: AROM;Both;20 reps;Seated      Assessment/Plan    PT Assessment Patient needs continued PT services  PT Diagnosis Difficulty walking;Abnormality of gait;Generalized weakness;Acute pain   PT Problem List Decreased strength;Decreased range of motion;Decreased balance;Decreased activity tolerance;Decreased mobility;Decreased knowledge of use of DME;Pain  PT Treatment Interventions DME instruction;Gait training;Stair training;Functional mobility training;Therapeutic activities;Therapeutic exercise;Balance training;Neuromuscular re-education;Patient/family education;Modalities   PT Goals (Current goals can be found in the Care Plan section) Acute Rehab PT Goals Patient Stated Goal: to go home and get this hip feeling better PT Goal Formulation: With patient Time For Goal Achievement: 10/04/13 Potential to Achieve Goals: Good    Frequency 7X/week    End of Session   Activity Tolerance: Patient limited by pain Patient left: in chair;with call bell/phone within reach           Time: 9417-4081 PT Time Calculation (min): 25 min   Charges:   PT Evaluation $Initial PT Evaluation Tier I: 1 Procedure PT Treatments $Therapeutic Activity: 8-22 mins        Kaelah Hayashi B. [place], [place], DPT [phone]   09/27/2013, 5:41 PM

## 2013-09-27 NOTE — Care Management Note (Signed)
CARE MANAGEMENT NOTE 09/27/2013  Patient:  Sheila Ramsey, Sheila Ramsey   Account Number:  [id number]  Date Initiated:  09/27/2013  Documentation initiated by:  Ricki Miller  Subjective/Objective Assessment:   63 yr old female admitted with osteoarthritis, s/p right total hip arthroplasty, anterior approach.     Action/Plan:   Patient preoperatively setup with [facility], no changes.   Anticipated DC Date:  09/29/2013   Anticipated DC Plan:  [place]  CM consult      [facility] Choice  HOME HEALTH  DURABLE MEDICAL EQUIPMENT   Choice offered to / List presented to:     DME arranged  [place]  3-N-1      DME agency  [employer]     [place] arranged  HH-2 PT      [place]   Status of service:  In process, will continue to follow Medicare Important Message given?   (If response is "NO", the following Medicare IM given date fields will be blank) Date Medicare IM given:   Medicare IM given by:   Date Additional Medicare IM given:   Additional Medicare IM given by:    Discharge Disposition:    Per UR Regulation:  Reviewed for med. necessity/level of care/duration of stay

## 2013-09-27 NOTE — Op Note (Signed)
OPERATIVE REPORT  PREOPERATIVE DIAGNOSIS: Osteoarthritis of the Right hip.   POSTOPERATIVE DIAGNOSIS: Osteoarthritis of the Right  hip.   PROCEDURE: Right total hip arthroplasty, anterior approach.   SURGEON: Gaynelle Arabian, MD   ASSISTANT: Arlee Muslim, PA-C  ANESTHESIA:  Spinal  ESTIMATED BLOOD LOSS:- 450 ml  DRAINS: Hemovac x1.   COMPLICATIONS: None   CONDITION: PACU - hemodynamically stable.   BRIEF CLINICAL NOTE: Sheila Ramsey is a 63 y.o. female who has advanced end-  stage arthritis of her Right  hip with progressively worsening pain and  dysfunction.The patient has failed nonoperative management and presents for  total hip arthroplasty.   PROCEDURE IN DETAIL: After successful administration of spinal  anesthetic, the traction boots for the [facility] bed were placed on both  feet and the patient was placed onto the [facility] bed, boots placed into the leg  holders. The Right hip was then isolated from the perineum with plastic  drapes and prepped and draped in the usual sterile fashion. ASIS and  greater trochanter were marked and a oblique incision was made, starting  at about 1 cm lateral and 2 cm distal to the ASIS and coursing towards  the anterior cortex of the femur. The skin was cut with a 10 blade  through subcutaneous tissue to the level of the fascia overlying the  tensor fascia lata muscle. The fascia was then incised in line with the  incision at the junction of the anterior third and posterior 2/3rd. The  muscle was teased off the fascia and then the interval between the TFL  and the rectus was developed. The Hohmann retractor was then placed at  the top of the femoral neck over the capsule. The vessels overlying the  capsule were cauterized and the fat on top of the capsule was removed.  A Hohmann retractor was then placed anterior underneath the rectus  femoris to give exposure to the entire anterior capsule. A T-shaped  capsulotomy was performed. The  edges were tagged and the femoral head  was identified.       Osteophytes are removed off the superior acetabulum.  The femoral neck was then cut in situ with an oscillating saw. Traction  was then applied to the left lower extremity utilizing the [facility]  traction. The femoral head was then removed. Retractors were placed  around the acetabulum and then circumferential removal of the labrum was  performed. Osteophytes were also removed. Reaming starts at 45 mm to  medialize and  Increased in 2 mm increments to 47 mm. We reamed in  approximately 40 degrees of abduction, 20 degrees anteversion. A 48 mm  pinnacle acetabular shell was then impacted in anatomic position under  fluoroscopic guidance with excellent purchase. We did not need to place  any additional dome screws. A 28 mm neutral + 4 marathon liner was then  placed into the acetabular shell.       The femoral lift was then placed along the lateral aspect of the femur  just distal to the vastus ridge. The leg was  externally rotated and capsule  was stripped off the inferior aspect of the femoral neck down to the  level of the lesser trochanter, this was done with electrocautery. The femur was lifted after this was performed. The  leg was then placed and extended in adducted position to essentially delivering the femur. We also removed the capsule superiorly and the  piriformis from the piriformis fossa  to gain excellent exposure of the  proximal femur. Rongeur was used to remove some cancellous bone to get  into the lateral portion of the proximal femur for placement of the  initial starter reamer. The starter broaches was placed  the starter broach  and was shown to go down the center of the canal. Broaching  with the  Corail system was then performed starting at size 8, coursing  Up to size 9. A size 9 had excellent torsional and rotational  and axial stability. The trial standard offset neck was then placed  with a 28 + 1.5 trial  head. The hip was then reduced. We confirmed that  the stem was in the canal both on AP and lateral x-rays. It also has excellent sizing. The hip was reduced with outstanding stability through full extension, full external rotation,  and then flexion in adduction internal rotation. AP pelvis was taken  and the leg lengths were measured and found to be within 2-3 mm of equal. Hip  was then dislocated again and the femoral head and neck removed. The  femoral broach was removed. Size 9 Corail stem with a standard offset  neck was then impacted into the femur following native anteversion. Has  excellent purchase in the canal. Excellent torsional and rotational and  axial stability. It is confirmed to be in the canal on AP and lateral  fluoroscopic views. The 28 + 1.5 ceramic head was placed and the hip  reduced with outstanding stability. Again AP pelvis was taken and it  confirmed that the leg lengths were equal. The wound was then copiously  irrigated with saline solution and the capsule reattached and repaired  with Ethibond suture.  20 mL of Exparel mixed with 50 mL of saline then additional 20 ml of .25% Bupivicaine injected into the capsule and into the edge of the tensor fascia lata as well as subcutaneous tissue. The fascia overlying the tensor fascia lata was  then closed with a running #1 V-Loc. Subcu was closed with interrupted  2-0 Vicryl and subcuticular running 4-0 Monocryl. Incision was cleaned  and dried. Steri-Strips and a bulky sterile dressing applied. Hemovac  drain was hooked to suction and then he was awakened and transported to  recovery in stable condition.        Please note that a surgical assistant was a medical necessity for this procedure to perform it in a safe and expeditious manner. Assistant was necessary to provide appropriate retraction of vital neurovascular structures and to prevent femoral fracture and allow for anatomic placement of the prosthesis.  Gaynelle Arabian, M.D.

## 2013-09-28 ENCOUNTER — Encounter (HOSPITAL_COMMUNITY): Payer: Self-pay | Admitting: *Deleted

## 2013-09-28 LAB — BASIC METABOLIC PANEL
Anion gap: 11 (ref 5–15)
BUN: 10 mg/dL (ref 6–23)
CALCIUM: 8.4 mg/dL (ref 8.4–10.5)
CO2: 26 mEq/L (ref 19–32)
CREATININE: 0.73 mg/dL (ref 0.50–1.10)
Chloride: 105 mEq/L (ref 96–112)
GFR calc non Af Amer: >90 mL/min (ref >90)
GLUCOSE: 126 mg/dL — AB (ref 70–99)
POTASSIUM: 4.1 meq/L (ref 3.7–5.3)
Sodium: 142 mEq/L (ref 137–147)

## 2013-09-28 LAB — CBC
HEMATOCRIT: 37.9 % (ref 36.0–46.0)
HEMOGLOBIN: 12.9 g/dL (ref 12.0–15.0)
MCH: 31.5 pg (ref 26.0–34.0)
MCHC: 34 g/dL (ref 30.0–36.0)
MCV: 92.7 fL (ref 78.0–100.0)
Platelets: 289 10*3/uL (ref 150–400)
RBC: 4.09 MIL/uL (ref 3.87–5.11)
RDW: 12.7 % (ref 11.5–15.5)
WBC: 8.7 10*3/uL (ref 4.0–10.5)

## 2013-09-28 MED ORDER — RIVAROXABAN 10 MG PO TABS: 10.0000 mg | ORAL_TABLET | Freq: Every day | ORAL | Status: DC

## 2013-09-28 MED ORDER — ONDANSETRON HCL 4 MG PO TABS: 4.0000 mg | ORAL_TABLET | Freq: Four times a day (QID) | ORAL | Status: DC | PRN

## 2013-09-28 MED ORDER — HYDROMORPHONE HCL 2 MG PO TABS: 2.0000 mg | ORAL_TABLET | ORAL | Status: DC | PRN

## 2013-09-28 MED ORDER — TRAMADOL HCL 50 MG PO TABS: 50.0000 mg | ORAL_TABLET | Freq: Four times a day (QID) | ORAL | Status: DC | PRN

## 2013-09-28 MED ORDER — METHOCARBAMOL 500 MG PO TABS: 500.0000 mg | ORAL_TABLET | Freq: Four times a day (QID) | ORAL | Status: DC | PRN

## 2013-09-28 NOTE — Evaluation (Signed)
Occupational Therapy Evaluation Patient Details Name: Sheila Ramsey MRN: [id number] DOB: September 19, 1950 Today's Date: 09/28/2013    History of Present Illness 63 y.o. female admitted to [facility] on 09/27/13 for elective R direct anterior THA.  Pt with significant PMHx of HTN, knee arthroscopy, and L hip arthritis that will require hip replacement in a few months.     Clinical Impression   Pt admitted with the above diagnoses and presents with below problem list. Pt will benefit from continued acute OT to address the below listed deficits and maximize independence with basic ADLs prior to d/c home. PTA pt was modified independent with ADLs. Pt is currently at min guard level for LB ADLs and functional transfers.      Follow Up Recommendations  Supervision - Intermittent;No OT follow up    Equipment Recommendations  None recommended by OT;Other (comment) (pt has recommended equipment (3n1))    Recommendations for Other Services       Precautions / Restrictions Precautions Precautions: Fall Restrictions Weight Bearing Restrictions: Yes RLE Weight Bearing: Weight bearing as tolerated      Mobility Bed Mobility Overal bed mobility: Needs Assistance Bed Mobility: Supine to Sit     Supine to sit: Min guard     General bed mobility comments: HOB flat for supine>sit, cues for technique  Transfers Overall transfer level: Needs assistance Equipment used: Rolling walker (2 wheeled) Transfers: Sit to/from Stand Sit to Stand: Min guard         General transfer comment: min guard for safety    Balance Overall balance assessment: Needs assistance Sitting-balance support: No upper extremity supported;Feet supported Sitting balance-Leahy Scale: Good     Standing balance support: Bilateral upper extremity supported;During functional activity Standing balance-Leahy Scale: Fair Standing balance comment: able to complete LB dressing in standing at min guard level                             ADL Overall ADL's : Needs assistance/impaired Eating/Feeding: Set up;Sitting   Grooming: Set up;Sitting;Standing   Upper Body Bathing: Set up;Sitting   Lower Body Bathing: Min guard;Sit to/from stand   Upper Body Dressing : Set up;Sitting   Lower Body Dressing: Min guard;Sit to/from stand   Toilet Transfer: Min guard;Ambulation;BSC;RW (BSC over toilet)   Toileting- Clothing Manipulation and Hygiene: Min guard;Sit to/from stand   Tub/ Shower Transfer: Min guard;Ambulation;3 in 1;Rolling walker   Functional mobility during ADLs: Min guard;Rolling walker General ADL Comments: Educated pt on techniques and AE for safe completion of ADLs. Pt able to don LB dressing in sit-stand at min guard level without the use of AE. Discussed bathroom setup including toilet/shower transfers.      Vision                     Perception     Praxis      Pertinent Vitals/Pain Pain Assessment: 0-10 Pain Score: 3  Pain Location: R hip/back Pain Descriptors / Indicators: Aching Pain Intervention(s): Limited activity within patient's tolerance;Monitored during session;Repositioned     Hand Dominance Right   Extremity/Trunk Assessment Upper Extremity Assessment Upper Extremity Assessment: Overall WFL for tasks assessed   Lower Extremity Assessment Lower Extremity Assessment: Defer to PT evaluation       Communication Communication Communication: No difficulties   Cognition Arousal/Alertness: Awake/alert Behavior During Therapy: WFL for tasks assessed/performed Overall Cognitive Status: Within Functional Limits for tasks assessed  General Comments       Exercises       Shoulder Instructions      Home Living Family/patient expects to be discharged to:: Private residence Living Arrangements: Spouse/significant other Available Help at Discharge: Family;Available PRN/intermittently Type of Home: House Home Access: Level entry      Home Layout: Two level;Able to live on main level with bedroom/bathroom Alternate Level Stairs-Number of Steps: flight Alternate Level Stairs-Rails: Left Bathroom Shower/Tub: Occupational psychologist: Standard     Home Equipment: Cane - single point;Walker - 4 wheels;Shower seat;Bedside commode   Additional Comments: discussed with pt moving large furniture piece out of bathroom to make it more walker accessible, pt had been using piece to assist with toilet transfers PTA, discussed use of rw and BSC over toilet to assist with toilet transfers at d/c      Prior Functioning/Environment Level of Independence: Independent with assistive device(s) (cane)        Comments: drives, works as a Neurosurgeon Diagnosis: Acute pain   OT Problem List: Impaired balance (sitting and/or standing);Decreased knowledge of use of DME or AE;Decreased knowledge of precautions;Pain   OT Treatment/Interventions: Self-care/ADL training;Therapeutic exercise;DME and/or AE instruction;Therapeutic activities;Patient/family education;Balance training    OT Goals(Current goals can be found in the care plan section) Acute Rehab OT Goals Patient Stated Goal: to get back to working OT Goal Formulation: With patient Time For Goal Achievement: 10/05/13 Potential to Achieve Goals: Good ADL Goals Pt Will Perform Lower Body Bathing: with modified independence;sit to/from stand;with adaptive equipment Pt Will Perform Lower Body Dressing: with modified independence;sit to/from stand Pt Will Transfer to Toilet: with modified independence;ambulating (3n1 over toilet) Pt Will Perform Toileting - Clothing Manipulation and hygiene: with modified independence;sit to/from stand Pt Will Perform Tub/Shower Transfer: with modified independence;ambulating;3 in 1;rolling walker  OT Frequency: Min 2X/week   Barriers to D/C:            Co-evaluation              End of Session Equipment Utilized During  Treatment: Gait belt;Rolling walker  Activity Tolerance: Patient tolerated treatment well Patient left: in chair;with call bell/phone within reach;with nursing/sitter in room   Time: 8786-7672 OT Time Calculation (min): 32 min Charges:  OT General Charges $OT Visit: 1 Procedure OT Evaluation $Initial OT Evaluation Tier I: 1 Procedure OT Treatments $Self Care/Home Management : 23-37 mins G-Codes:    Sheila Ramsey 10/13/13, 10:54 AM

## 2013-09-28 NOTE — Progress Notes (Signed)
   Subjective: 1 Day Post-Op Procedure(s) (LRB): RIGHT TOTAL HIP ARTHROPLASTY ANTERIOR APPROACH (Right) Patient reports pain as mild.   Plan is to go Home after hospital stay.  Objective: Vital signs in last 24 hours: Temp:  [97.5 F (36.4 C)-98.6 F (37 C)] 97.5 F (36.4 C) (09/26 0530) Pulse Rate:  [52-98] 52 (09/26 0530) Resp:  [12-23] 18 (09/26 0400) BP: (101-191)/(54-85) 141/54 mmHg (09/26 0530) SpO2:  [94 %-100 %] 99 % (09/26 0530) Weight:  [195 lb (88.451 kg)] 195 lb (88.451 kg) (09/25 0825)  Intake/Output from previous day:  Intake/Output Summary (Last 24 hours) at 09/28/13 0738 Last data filed at 09/28/13 0600  Gross per 24 hour  Intake   3365 ml  Output   1550 ml  Net   1815 ml    Intake/Output this shift:    Labs:  Recent Labs  09/28/13 0440  HGB 12.9    Recent Labs  09/28/13 0440  WBC 8.7  RBC 4.09  HCT 37.9  PLT 289    Recent Labs  09/28/13 0440  NA 142  K 4.1  CL 105  CO2 26  BUN 10  CREATININE 0.73  GLUCOSE 126*  CALCIUM 8.4   No results found for this basename: LABPT, INR,  in the last 72 hours  EXAM General - Patient is Alert, Appropriate and Oriented Extremity - Neurologically intact Neurovascular intact No cellulitis present Compartment soft Dressing - dressing C/D/I Motor Function - intact, moving foot and toes well on exam.  Hemovac pulled without difficulty.  Past Medical History  Diagnosis Date  . Hypertension   . Macular degeneration   . Cataract   . Sleep apnea   . Diverticular disease   . Arthritis   . PONV (postoperative nausea and vomiting)     Assessment/Plan: 1 Day Post-Op Procedure(s) (LRB): RIGHT TOTAL HIP ARTHROPLASTY ANTERIOR APPROACH (Right) Principal Problem:   OA (osteoarthritis) of hip   Advance diet Up with therapy D/C IV fluids Discharge home with home health after 2nd therapy session today  DVT Prophylaxis - Xarelto Weight Bearing As Tolerated right Leg Hemovac  Pulled   Ranisha Allaire V

## 2013-09-28 NOTE — Plan of Care (Signed)
Problem: Consults Goal: Diagnosis- Total Joint Replacement Outcome: Completed/Met Date Met:  09/28/13 Primary Total Hip Right

## 2013-09-28 NOTE — Progress Notes (Signed)
Discharge instructions and prescriptions reviewed with patient. No questions or concerns at this time. IV removed with no complications. Vital signs stable. Patient set up with [facility] and has equipment already. Patient discharged via wheelchair with personal belongings accompanied by husband.

## 2013-09-28 NOTE — Progress Notes (Signed)
Physical Therapy Treatment Patient Details Name: Sheila Ramsey MRN: [id number] DOB: June 23, 1950 Today's Date: 09/28/2013    History of Present Illness 63 y.o. female admitted to [facility] on 09/27/13 for elective R direct anterior THA.  Pt with significant PMHx of HTN, knee arthroscopy, and L hip arthritis that will require hip replacement in a few months.      PT Comments    Pt progressing well with mobility.  Ambulated ~150' with RW & completed stair training.  Patient safe to D/C from a mobility standpoint based on progression towards goals set on PT eval.   Follow Up Recommendations  Home health PT;Supervision for mobility/OOB     Equipment Recommendations       Recommendations for Other Services       Precautions / Restrictions Precautions Precautions: Fall Restrictions Weight Bearing Restrictions: Yes RLE Weight Bearing: Weight bearing as tolerated    Mobility  Bed Mobility Overal bed mobility: Needs Assistance Bed Mobility: Supine to Sit     Supine to sit: Min guard     General bed mobility comments: HOB flat for supine>sit, cues for technique  Transfers Overall transfer level: Needs assistance Equipment used: Rolling walker (2 wheeled) Transfers: Sit to/from Stand Sit to Stand: Min guard         General transfer comment: cues to reinforce hand placement  Ambulation/Gait Ambulation/Gait assistance: Min guard Ambulation Distance (Feet): 150 Feet Assistive device: Rolling walker (2 wheeled) Gait Pattern/deviations: Step-through pattern;Antalgic     General Gait Details: Pt with increased fluidity of gait today.  Encouragement to increase RLE WBing & increase heel strike.  Guarding for safety.     Stairs Stairs: Yes Stairs assistance: Min guard Stair Management: No rails;Step to pattern;Forwards;With walker Number of Stairs: 1 (2x's) General stair comments: cues for technique  Wheelchair Mobility    Modified Rankin (Stroke Patients Only)        Balance Overall balance assessment: Needs assistance Sitting-balance support: No upper extremity supported;Feet supported Sitting balance-Leahy Scale: Good     Standing balance support: Bilateral upper extremity supported;During functional activity Standing balance-Leahy Scale: Fair Standing balance comment: able to complete LB dressing in standing at min guard level                     Cognition Arousal/Alertness: Awake/alert Behavior During Therapy: WFL for tasks assessed/performed Overall Cognitive Status: Within Functional Limits for tasks assessed                      Exercises      General Comments        Pertinent Vitals/Pain Pain Assessment: 0-10 Pain Score: 3  Pain Location: Rt hip Pain Descriptors / Indicators: Burning Pain Intervention(s): Monitored during session;Repositioned;Premedicated before session    Home Living Family/patient expects to be discharged to:: Private residence Living Arrangements: Spouse/significant other Available Help at Discharge: Family;Available PRN/intermittently Type of Home: House Home Access: Level entry   Home Layout: Two level;Able to live on main level with bedroom/bathroom Home Equipment: Kasandra Knudsen - single point;Walker - 4 wheels;Shower seat;Bedside commode Additional Comments: discussed with pt moving large furniture piece out of bathroom to make it more walker accessible, pt had been using piece to assist with toilet transfers PTA, discussed use of rw and BSC over toilet to assist with toilet transfers at d/c    Prior Function Level of Independence: Independent with assistive device(s) (cane)      Comments: drives, works as a Hydrographic surveyor  Goals (current goals can now be found in the care plan section) Acute Rehab PT Goals Patient Stated Goal: to get back to working PT Goal Formulation: With patient Time For Goal Achievement: 10/04/13 Potential to Achieve Goals: Good Progress towards PT goals: Progressing  toward goals    Frequency  7X/week    PT Plan Current plan remains appropriate    Co-evaluation             End of Session   Activity Tolerance: Patient tolerated treatment well Patient left: in chair;with call bell/phone within reach     Time: 1015-1038 PT Time Calculation (min): 23 min  Charges:  $Gait Training: 23-37 mins                    G Codes:      Sena Hitch 09/28/2013, 12:32 PM   Sarajane Marek, PTA [phone] 09/28/2013

## 2013-09-30 ENCOUNTER — Encounter (HOSPITAL_COMMUNITY): Payer: Self-pay | Admitting: Orthopedic Surgery

## 2013-10-04 NOTE — Discharge Summary (Signed)
Physician Discharge Summary   Patient ID: Sheila Ramsey MRN: [id number] DOB/AGE: 1950-01-14 63 y.o.  Admit date: 09/27/2013 Discharge date: 09/28/2013  Primary Diagnosis:  Osteoarthritis of the Right hip.   Admission Diagnoses:  Past Medical History  Diagnosis Date  . Hypertension   . Macular degeneration   . Cataract   . Sleep apnea   . Diverticular disease   . Arthritis   . PONV (postoperative nausea and vomiting)    Discharge Diagnoses:   Principal Problem:   OA (osteoarthritis) of hip  Estimated body mass index is 34.55 kg/(m^2) as calculated from the following:   Height as of this encounter: _0  (1.6 m).   Weight as of this encounter: 88.451 kg (195 lb).  Procedure(s) (LRB): RIGHT TOTAL HIP ARTHROPLASTY ANTERIOR APPROACH (Right)   Consults: None  HPI: Sheila Ramsey is a 63 y.o. female who has advanced end-  stage arthritis of her Right hip with progressively worsening pain and  dysfunction.The patient has failed nonoperative management and presents for  total hip arthroplasty.   Laboratory Data: Admission on 09/27/2013, Discharged on 09/28/2013  Component Date Value Ref Range Status  . ABO/RH(D) 09/20/2013 B POS   Final  . Glucose-Capillary 09/27/2013 238* 70 - 99 mg/dL Final  . WBC 09/28/2013 8.7  4.0 - 10.5 K/uL Final  . RBC 09/28/2013 4.09  3.87 - 5.11 MIL/uL Final  . Hemoglobin 09/28/2013 12.9  12.0 - 15.0 g/dL Final  . HCT 09/28/2013 37.9  36.0 - 46.0 % Final  . MCV 09/28/2013 92.7  78.0 - 100.0 fL Final  . MCH 09/28/2013 31.5  26.0 - 34.0 pg Final  . MCHC 09/28/2013 34.0  30.0 - 36.0 g/dL Final  . RDW 09/28/2013 12.7  11.5 - 15.5 % Final  . Platelets 09/28/2013 289  150 - 400 K/uL Final  . Sodium 09/28/2013 142  137 - 147 mEq/L Final  . Potassium 09/28/2013 4.1  3.7 - 5.3 mEq/L Final  . Chloride 09/28/2013 105  96 - 112 mEq/L Final  . CO2 09/28/2013 26  19 - 32 mEq/L Final  . Glucose, Bld 09/28/2013 126* 70 - 99 mg/dL Final  . BUN 09/28/2013 10  6 -  23 mg/dL Final  . Creatinine, Ser 09/28/2013 0.73  0.50 - 1.10 mg/dL Final  . Calcium 09/28/2013 8.4  8.4 - 10.5 mg/dL Final  . GFR calc non Af Amer 09/28/2013 >90  >90 mL/min Final  . GFR calc Af Amer 09/28/2013 >90  >90 mL/min Final   Comment: (NOTE)                          The eGFR has been calculated using the CKD EPI equation.                          This calculation has not been validated in all clinical situations.                          eGFR's persistently <90 mL/min signify possible Chronic Kidney                          Disease.  Georgiann Hahn gap 09/28/2013 11  5 - 15 Final  Hospital Outpatient Visit on 09/20/2013  Component Date Value Ref Range Status  . aPTT 09/20/2013 29  24 - 37 seconds Final  . WBC  09/20/2013 4.7  4.0 - 10.5 K/uL Final  . RBC 09/20/2013 5.03  3.87 - 5.11 MIL/uL Final  . Hemoglobin 09/20/2013 16.0* 12.0 - 15.0 g/dL Final  . HCT 09/20/2013 46.9* 36.0 - 46.0 % Final  . MCV 09/20/2013 93.2  78.0 - 100.0 fL Final  . MCH 09/20/2013 31.8  26.0 - 34.0 pg Final  . MCHC 09/20/2013 34.1  30.0 - 36.0 g/dL Final  . RDW 09/20/2013 12.8  11.5 - 15.5 % Final  . Platelets 09/20/2013 294  150 - 400 K/uL Final  . Sodium 09/20/2013 141  137 - 147 mEq/L Final  . Potassium 09/20/2013 4.0  3.7 - 5.3 mEq/L Final  . Chloride 09/20/2013 103  96 - 112 mEq/L Final  . CO2 09/20/2013 24  19 - 32 mEq/L Final  . Glucose, Bld 09/20/2013 130* 70 - 99 mg/dL Final  . BUN 09/20/2013 12  6 - 23 mg/dL Final  . Creatinine, Ser 09/20/2013 0.80  0.50 - 1.10 mg/dL Final  . Calcium 09/20/2013 9.7  8.4 - 10.5 mg/dL Final  . Total Protein 09/20/2013 7.5  6.0 - 8.3 g/dL Final  . Albumin 09/20/2013 4.0  3.5 - 5.2 g/dL Final  . AST 09/20/2013 20  0 - 37 U/L Final  . ALT 09/20/2013 20  0 - 35 U/L Final  . Alkaline Phosphatase 09/20/2013 72  39 - 117 U/L Final  . Total Bilirubin 09/20/2013 0.5  0.3 - 1.2 mg/dL Final  . GFR calc non Af Amer 09/20/2013 78* >90 mL/min Final  . GFR calc Af Amer  09/20/2013 >90  >90 mL/min Final   Comment: (NOTE)                          The eGFR has been calculated using the CKD EPI equation.                          This calculation has not been validated in all clinical situations.                          eGFR's persistently <90 mL/min signify possible Chronic Kidney                          Disease.  . Anion gap 09/20/2013 14  5 - 15 Final  . Prothrombin Time 09/20/2013 13.3  11.6 - 15.2 seconds Final  . INR 09/20/2013 1.01  0.00 - 1.49 Final  . ABO/RH(D) 09/20/2013 B POS   Final  . Antibody Screen 09/20/2013 NEG   Final  . Sample Expiration 09/20/2013 10/04/2013   Final  . Color, Urine 09/20/2013 YELLOW  YELLOW Final  . APPearance 09/20/2013 CLEAR  CLEAR Final  . Specific Gravity, Urine 09/20/2013 1.020  1.005 - 1.030 Final  . pH 09/20/2013 6.5  5.0 - 8.0 Final  . Glucose, UA 09/20/2013 NEGATIVE  NEGATIVE mg/dL Final  . Hgb urine dipstick 09/20/2013 NEGATIVE  NEGATIVE Final  . Bilirubin Urine 09/20/2013 NEGATIVE  NEGATIVE Final  . Ketones, ur 09/20/2013 NEGATIVE  NEGATIVE mg/dL Final  . Protein, ur 09/20/2013 NEGATIVE  NEGATIVE mg/dL Final  . Urobilinogen, UA 09/20/2013 0.2  0.0 - 1.0 mg/dL Final  . Nitrite 09/20/2013 NEGATIVE  NEGATIVE Final  . Leukocytes, UA 09/20/2013 NEGATIVE  NEGATIVE Final   MICROSCOPIC NOT DONE ON URINES WITH NEGATIVE PROTEIN, BLOOD, LEUKOCYTES, NITRITE,  OR GLUCOSE <1000 mg/dL.  Marland Kitchen MRSA, PCR 09/20/2013 NEGATIVE  NEGATIVE Final  . Staphylococcus aureus 09/20/2013 NEGATIVE  NEGATIVE Final   Comment:                                 The Xpert SA Assay (FDA                          approved for NASAL specimens                          in patients over 58 years of age),                          is one component of                          a comprehensive surveillance                          program.  Test performance has                          been validated by [employer] for patients  greater                          than or equal to 47 year old.                          It is not intended                          to diagnose infection nor to                          guide or monitor treatment.     X-Rays:Dg Hip Complete Right  09/20/2013   CLINICAL DATA:  For right total hip replacement  EXAM: RIGHT HIP - COMPLETE 2+ VIEW  COMPARISON:  None.  FINDINGS: There is severe degenerative joint disease of both hips with complete loss of joint space, sclerosis, spurring, and subchondral cyst formation. No acute fracture is seen. The pelvic rami are intact. The SI joints are corticated.  IMPRESSION: Severe degenerative joint disease of both hips.   Electronically Signed   By: Ivar Drape M.D.   On: 09/20/2013 14:08   Dg Hip Operative Right  09/27/2013   CLINICAL DATA:  Right hip replacement  EXAM: DG OPERATIVE RIGHT HIP  TECHNIQUE: Two spot films were obtained intraoperatively. 10 seconds of fluoroscopy was utilized.  COMPARISON:  09/20/2013  FINDINGS: A right hip prosthesis is now visualized in satisfactory position. No acute abnormality is noted.  IMPRESSION: Successful right hip replacement   Electronically Signed   By: Inez Catalina M.D.   On: 09/27/2013 11:46   Dg Pelvis Portable  09/27/2013   CLINICAL DATA:  Status post right hip replacement  EXAM: PORTABLE PELVIS 1-2 VIEWS  COMPARISON:  None.  FINDINGS: Surgical drain is noted in place. A right  hip prosthesis is seen in satisfactory position. No acute abnormality is noted.  IMPRESSION: Status post right hip replacement   Electronically Signed   By: Inez Catalina M.D.   On: 09/27/2013 13:09    EKG:No orders found for this or any previous visit.   Hospital Course: Patient was admitted to [facility] and taken to the OR and underwent the above state procedure without complications.  Patient tolerated the procedure well and was later transferred to the recovery room and then to the orthopaedic floor for postoperative care.   They were given PO and IV analgesics for pain control following their surgery.  They were given 24 hours of postoperative antibiotics of  Anti-infectives   Start     Dose/Rate Route Frequency Ordered Stop   09/27/13 1630  ceFAZolin (ANCEF) IVPB 2 g/50 mL premix     2 g 100 mL/hr over 30 Minutes Intravenous Every 6 hours 09/27/13 1430 09/28/13 0102   09/27/13 0600  ceFAZolin (ANCEF) IVPB 2 g/50 mL premix     2 g 100 mL/hr over 30 Minutes Intravenous On call to O.R. 09/26/13 1419 09/27/13 1026     and started on DVT prophylaxis in the form of Xarelto.   PT and OT were ordered for total hip protocol.  The patient was allowed to be WBAT with therapy. Discharge planning was consulted to help with postop disposition and equipment needs.  Patient had a good night on the evening of surgery.  They started to get up OOB with therapy on day one.  Hemovac drain was pulled without difficulty.  Patient was seen in rounds and was ready to go home later on day one after therapy sessions.  Diet: Cardiac diet Activity:WBAT Follow-up:in 2 weeks Disposition - Home Discharged Condition: good      Medication List    STOP taking these medications       aspirin EC 81 MG tablet     B COMPLETE Tabs     bisoprolol 5 MG tablet  Commonly known as:  ZEBETA     HYALURONIC ACID PO     magnesium oxide 400 MG tablet  Commonly known as:  MAG-OX     Vitamin B-12 2500 MCG Subl     Vitamin D3 10000 UNITS capsule      TAKE these medications       HYDROmorphone 2 MG tablet  Commonly known as:  DILAUDID  Take 1-2 tablets (2-4 mg total) by mouth every 4 (four) hours as needed for severe pain.     LORazepam 0.5 MG tablet  Commonly known as:  ATIVAN  Take 0.5 mg by mouth every 8 (eight) hours.     mesalamine 1.2 G EC tablet  Commonly known as:  LIALDA  Take 1.2 g by mouth daily with breakfast.     methocarbamol 500 MG tablet  Commonly known as:  ROBAXIN  Take 1 tablet (500 mg total) by mouth every 6  (six) hours as needed for muscle spasms.     ondansetron 4 MG tablet  Commonly known as:  ZOFRAN  Take 1 tablet (4 mg total) by mouth every 6 (six) hours as needed for nausea.     rivaroxaban 10 MG Tabs tablet  Commonly known as:  XARELTO  Take 1 tablet (10 mg total) by mouth daily with breakfast.     traMADol 50 MG tablet  Commonly known as:  ULTRAM  Take 1-2 tablets (50-100 mg total) by mouth every 6 (six) hours  as needed for moderate pain.     zolpidem 10 MG tablet  Commonly known as:  AMBIEN  Take 5 mg by mouth at bedtime as needed for sleep.           Follow-up Information   Follow up with Gearlean Alf, MD. Schedule an appointment as soon as possible for a visit on 10/10/2013. (Call [phone] Monday to make the appointment)    Specialty:  Orthopedic Surgery   Contact information:   [address] [place] [address] [phone]       Signed: Arlee Muslim, PA-C Orthopaedic Surgery 10/04/2013, 9:31 AM

## 2013-11-26 ENCOUNTER — Ambulatory Visit: Payer: Self-pay | Admitting: Orthopedic Surgery

## 2013-11-26 NOTE — Progress Notes (Signed)
Preoperative surgical orders have been place into the Epic hospital system for Sheila Ramsey on 11/26/2013, 1:39 PM  by Mickel Crow for surgery on 12-10-2013.  Preop Total Hip - Anterior Approach orders including Experel Injecion, IV Tylenol, and IV Decadron as long as there are no contraindications to the above medications. Arlee Muslim, PA-C

## 2013-11-27 NOTE — H&P (Signed)
TOTAL HIP ADMISSION H&P  Patient is admitted for left total hip arthroplasty.  Subjective:  Chief Complaint: left hip pain  HPI: Sheila Ramsey, 63 y.o. female, has a history of pain and functional disability in the left hip(s) due to arthritis and patient has failed non-surgical conservative treatments for greater than 12 weeks to include NSAID's and/or analgesics, corticosteriod injections and activity modification.  Onset of symptoms was gradual starting 1 year ago with gradually worsening course since that time.The patient noted no past surgery on the left hip(s).  Patient currently rates pain in the left hip at 8 out of 10 with activity. Patient has night pain, worsening of pain with activity and weight bearing, pain that interfers with activities of daily living, pain with passive range of motion and crepitus. Patient has evidence of periarticular osteophytes and joint space narrowing by imaging studies. This condition presents safety issues increasing the risk of falls. There is no current active infection.  Patient Active Problem List   Diagnosis Date Noted  . OA (osteoarthritis) of hip 09/27/2013  . DIVERTICULOSIS-COLON 08/10/2009  . ANAL OR RECTAL PAIN 08/10/2009  . ABDOMINAL PAIN-RUQ 08/10/2009  . PERSONAL HX COLONIC POLYPS 08/10/2009   Past Medical History  Diagnosis Date  . Hypertension   . Macular degeneration   . Cataract   . Sleep apnea   . Diverticular disease   . Arthritis   . PONV (postoperative nausea and vomiting)     Past Surgical History  Procedure Laterality Date  . Eye surgery    . Knee arthroscopy    . Total hip arthroplasty Right 09/27/2013    Procedure: RIGHT TOTAL HIP ARTHROPLASTY ANTERIOR APPROACH;  Surgeon: Gearlean Alf, MD;  Location: [facility];  Service: Orthopedics;  Laterality: Right;     Current outpatient prescriptions:   mesalamine (LIALDA) 1.2 G EC tablet, Take 1.2 g by mouth daily with breakfast., Disp: , Rfl:  zolpidem (AMBIEN) 10 MG  tablet, Take 5 mg by mouth at bedtime as needed for sleep., Disp: , Rfl:  Triamterene-HCTZ (37.5-25MG  Capsule, Oral) Active.  Allergies  Allergen Reactions  . Hydrocodone Nausea Only  . Oxycodone Nausea Only    History  Substance Use Topics  . Smoking status: Never Smoker   . Smokeless tobacco: None  . Alcohol Use: Occ. Drinks wine    Family History Hypertension- mother  Review of Systems  Constitutional: Negative.   HENT: Negative.   Eyes: Negative.   Respiratory: Negative.   Cardiovascular: Negative.   Gastrointestinal: Negative.   Genitourinary: Negative.   Musculoskeletal: Positive for joint pain. Negative for myalgias, back pain, falls and neck pain.       Left hip pain  Skin: Negative.   Neurological: Negative.   Endo/Heme/Allergies: Negative.   Psychiatric/Behavioral: Negative.     Objective:  Physical Exam  Constitutional: She is oriented to person, place, and time. She appears well-developed. No distress.  Overweight  HENT:  Head: Normocephalic and atraumatic.  Right Ear: External ear normal.  Left Ear: External ear normal.  Nose: Nose normal.  Eyes: Conjunctivae and EOM are normal.  Neck: Normal range of motion. Neck supple.  Cardiovascular: Normal rate, regular rhythm, normal heart sounds and intact distal pulses.   No murmur heard. Respiratory: Effort normal and breath sounds normal. No respiratory distress. She has no wheezes.  GI: Soft. Bowel sounds are normal. She exhibits no distension. There is no tenderness.  Musculoskeletal:       Right hip: Normal.  Left hip: She exhibits decreased range of motion, decreased strength and crepitus.       Right knee: Normal.       Left knee: Normal.       Right lower leg: She exhibits no tenderness and no swelling.       Left lower leg: She exhibits no tenderness and no swelling.  The right hip flexes to 120, rotate in 30, out 40 and abduct 40 without discomfort.The left hip flexion to 90. No rotation,  internal or externally . There is about 10 degrees of abduction.  Neurological: She is alert and oriented to person, place, and time. She has normal strength and normal reflexes. No sensory deficit.  Skin: No rash noted. She is not diaphoretic. No erythema.  Psychiatric: She has a normal mood and affect. Her behavior is normal.    Vitals  Weight: 197 lb Height: 63in Body Surface Area: 1.92 m Body Mass Index: 34.9 kg/m  Pulse: 76 (Regular)  BP: 152/88 (Sitting, Left Arm, Standard)  Imaging Review Plain radiographs demonstrate severe degenerative joint disease of the left hip(s). The bone quality appears to be adequate for age and reported activity level.  Assessment/Plan:  End stage arthritis, left hip(s)  The patient history, physical examination, clinical judgement of the provider and imaging studies are consistent with end stage degenerative joint disease of the left hip(s) and total hip arthroplasty is deemed medically necessary. The treatment options including medical management, injection therapy, arthroscopy and arthroplasty were discussed at length. The risks and benefits of total hip arthroplasty were presented and reviewed. The risks due to aseptic loosening, infection, stiffness, dislocation/subluxation,  thromboembolic complications and other imponderables were discussed.  The patient acknowledged the explanation, agreed to proceed with the plan and consent was signed. Patient is being admitted for inpatient treatment for surgery, pain control, PT, OT, prophylactic antibiotics, VTE prophylaxis, progressive ambulation and ADL's and discharge planning.The patient is planning to be discharged home with home health services (wants Marily Memos with [facility])  Wants spinal Wants anti-emetic preop  DOES NOT WANT BAIR HUGGER TXA IV Tramadol ok  PCP - Dr. Patric Dykes    Ardeen Jourdain, PA-C

## 2013-12-02 ENCOUNTER — Encounter (HOSPITAL_COMMUNITY): Payer: Self-pay

## 2013-12-02 ENCOUNTER — Encounter (HOSPITAL_COMMUNITY)
Admission: RE | Admit: 2013-12-02 | Discharge: 2013-12-02 | Disposition: A | Discharge status: Home / Self Care | Payer: BC Managed Care – PPO | Source: Ambulatory Visit | Attending: Orthopedic Surgery | Admitting: Orthopedic Surgery

## 2013-12-02 ENCOUNTER — Ambulatory Visit (HOSPITAL_COMMUNITY)
Admission: RE | Admit: 2013-12-02 | Discharge: 2013-12-02 | Disposition: A | Discharge status: Home / Self Care | Payer: BC Managed Care – PPO | Source: Ambulatory Visit | Attending: Orthopedic Surgery | Admitting: Orthopedic Surgery

## 2013-12-02 DIAGNOSIS — M1612 Unilateral primary osteoarthritis, left hip: Secondary | ICD-10-CM

## 2013-12-02 LAB — URINALYSIS, ROUTINE W REFLEX MICROSCOPIC
BILIRUBIN URINE: NEGATIVE
GLUCOSE, UA: NEGATIVE mg/dL
HGB URINE DIPSTICK: NEGATIVE
Ketones, ur: NEGATIVE mg/dL
Leukocytes, UA: NEGATIVE
Nitrite: NEGATIVE
PH: 5.5 (ref 5.0–8.0)
Protein, ur: NEGATIVE mg/dL
Specific Gravity, Urine: 1.028 (ref 1.005–1.030)
Urobilinogen, UA: 0.2 mg/dL (ref 0.0–1.0)

## 2013-12-02 LAB — COMPREHENSIVE METABOLIC PANEL
ALBUMIN: 3.7 g/dL (ref 3.5–5.2)
ALT: 16 U/L (ref 0–35)
ANION GAP: 11 (ref 5–15)
AST: 16 U/L (ref 0–37)
Alkaline Phosphatase: 78 U/L (ref 39–117)
BILIRUBIN TOTAL: 0.3 mg/dL (ref 0.3–1.2)
BUN: 21 mg/dL (ref 6–23)
CHLORIDE: 101 meq/L (ref 96–112)
CO2: 29 mEq/L (ref 19–32)
CREATININE: 0.84 mg/dL (ref 0.50–1.10)
Calcium: 9.7 mg/dL (ref 8.4–10.5)
GFR, EST AFRICAN AMERICAN: 85 mL/min — AB (ref >90)
GFR, EST NON AFRICAN AMERICAN: 73 mL/min — AB (ref >90)
GLUCOSE: 133 mg/dL — AB (ref 70–99)
Potassium: 4 mEq/L (ref 3.7–5.3)
Sodium: 141 mEq/L (ref 137–147)
Total Protein: 7.2 g/dL (ref 6.0–8.3)

## 2013-12-02 LAB — CBC
HEMATOCRIT: 43.8 % (ref 36.0–46.0)
HEMOGLOBIN: 14.5 g/dL (ref 12.0–15.0)
MCH: 29.6 pg (ref 26.0–34.0)
MCHC: 33.1 g/dL (ref 30.0–36.0)
MCV: 89.4 fL (ref 78.0–100.0)
Platelets: 304 10*3/uL (ref 150–400)
RBC: 4.9 MIL/uL (ref 3.87–5.11)
RDW: 12.8 % (ref 11.5–15.5)
WBC: 5.2 10*3/uL (ref 4.0–10.5)

## 2013-12-02 LAB — SURGICAL PCR SCREEN
MRSA, PCR: NEGATIVE
Staphylococcus aureus: NEGATIVE

## 2013-12-02 LAB — PROTIME-INR
INR: 1.02 (ref 0.00–1.49)
PROTHROMBIN TIME: 13.5 s (ref 11.6–15.2)

## 2013-12-02 LAB — TYPE AND SCREEN
ABO/RH(D): B POS
Antibody Screen: NEGATIVE

## 2013-12-02 LAB — APTT: aPTT: 29 seconds (ref 24–37)

## 2013-12-02 NOTE — Progress Notes (Addendum)
Dx with heart murmur in sept....was sent to see Dr Claudie Leach @ [facility] ) on [address].  Had a nuclear stress test, echo--both came out normal.  Ekg came out abnl, so that's the reason for all the testing.  Denies any chest / cardio issues.   LOV with Claudie Leach was in Sept. 2015.  She will not be bringing her CPAP mask, and she will bring her ted hose from her previous surgery.  She had sleep study done last year, and a nuclear stress and echo done just in Sept. 2015 at [facility]  [phone].  I spoke with Nevin Bloodgood from there and she will fax them to me.  Both tests came out "normal".Marland Kitchen DA

## 2013-12-02 NOTE — Pre-Procedure Instructions (Signed)
Sheila Ramsey  12/02/2013   Your procedure is scheduled on:  Tuesday, December 8th  Report to [facility] Admitting at 1 PM.  Call this number if you have problems the morning of surgery: [phone]   Remember:   Do not eat food or drink liquids after midnight.   Take these medicines the morning of surgery with A SIP OF WATER:    Do not wear jewelry, make-up or nail polish.  Do not wear lotions, powders, or perfumes,deodorant.  Do not shave 48 hours prior to surgery. Men may shave face and neck.  Do not bring valuables to the hospital.  [facility] is not responsible  for any belongings or valuables.               Contacts, dentures or bridgework may not be worn into surgery.  Leave suitcase in the car. After surgery it may be brought to your room.  For patients admitted to the hospital, discharge time is determined by your  treatment team.          Please read over the following fact sheets that you were given: Pain Booklet, Coughing and Deep Breathing, Blood Transfusion Information, MRSA Information and Surgical Site Infection Prevention  [place] - Preparing for Surgery  Before surgery, you can play an important role.  Because skin is not sterile, your skin needs to be as free of germs as possible.  You can reduce the number of germs on you skin by washing with CHG (chlorahexidine gluconate) soap before surgery.  CHG is an antiseptic cleaner which kills germs and bonds with the skin to continue killing germs even after washing.  Please DO NOT use if you have an allergy to CHG or antibacterial soaps.  If your skin becomes reddened/irritated stop using the CHG and inform your nurse when you arrive at Short Stay.  Do not shave (including legs and underarms) for at least 48 hours prior to the first CHG shower.  You may shave your face.  Please follow these instructions carefully:   1.  Shower with CHG Soap the night before surgery and the morning of Surgery.  2.  If  you choose to wash your hair, wash your hair first as usual with your normal shampoo.  3.  After you shampoo, rinse your hair and body thoroughly to remove the shampoo.  4.  Use CHG as you would any other liquid soap.  You can apply CHG directly to the skin and wash gently with scrungie or a clean washcloth.  5.  Apply the CHG Soap to your body ONLY FROM THE NECK DOWN.  Do not use on open wounds or open sores.  Avoid contact with your eyes, ears, mouth and genitals (private parts).  Wash genitals (private parts) with your normal soap.  6.  Wash thoroughly, paying special attention to the area where your surgery will be performed.  7.  Thoroughly rinse your body with warm water from the neck down.  8.  DO NOT shower/wash with your normal soap after using and rinsing off the CHG Soap.  9.  Pat yourself dry with a clean towel.            10.  Wear clean pajamas.            11.  Place clean sheets on your bed the night of your first shower and do not sleep with pets.  Day of Surgery  Do not apply any lotions/deoderants the  morning of surgery.  Please wear clean clothes to the hospital/surgery center.

## 2013-12-09 MED ORDER — BUPIVACAINE LIPOSOME 1.3 % IJ SUSP
20.0000 mL | Freq: Once | INTRAMUSCULAR | Status: DC
  Filled 2013-12-09: qty 20

## 2013-12-09 MED ORDER — CEFAZOLIN SODIUM-DEXTROSE 2-3 GM-% IV SOLR
2.0000 g | INTRAVENOUS | Status: AC
  Administered 2013-12-10: 2 g via INTRAVENOUS

## 2013-12-09 MED ORDER — ACETAMINOPHEN 10 MG/ML IV SOLN: 1000.0000 mg | Freq: Once | INTRAVENOUS | Status: DC

## 2013-12-09 MED ORDER — TRANEXAMIC ACID 100 MG/ML IV SOLN
1000.0000 mg | INTRAVENOUS | Status: DC
  Filled 2013-12-09: qty 10

## 2013-12-09 MED ORDER — DEXAMETHASONE SODIUM PHOSPHATE 10 MG/ML IJ SOLN: 10.0000 mg | Freq: Once | INTRAMUSCULAR | Status: DC

## 2013-12-10 ENCOUNTER — Encounter (HOSPITAL_COMMUNITY)
Admission: RE | Disposition: A | Discharge status: Home Health | Payer: Self-pay | Source: Ambulatory Visit | Attending: Orthopedic Surgery

## 2013-12-10 ENCOUNTER — Inpatient Hospital Stay (HOSPITAL_COMMUNITY): Payer: BC Managed Care – PPO | Admitting: Anesthesiology

## 2013-12-10 ENCOUNTER — Encounter (HOSPITAL_COMMUNITY): Payer: Self-pay | Admitting: Certified Registered Nurse Anesthetist

## 2013-12-10 ENCOUNTER — Inpatient Hospital Stay (HOSPITAL_COMMUNITY): Payer: BC Managed Care – PPO

## 2013-12-10 ENCOUNTER — Inpatient Hospital Stay (HOSPITAL_COMMUNITY)
Admission: RE | Admit: 2013-12-10 | Discharge: 2013-12-11 | DRG: 470 | Disposition: A | Discharge status: Home Health | Payer: BC Managed Care – PPO | Source: Ambulatory Visit | Attending: Orthopedic Surgery | Admitting: Orthopedic Surgery

## 2013-12-10 DIAGNOSIS — M1612 Unilateral primary osteoarthritis, left hip: Secondary | ICD-10-CM | POA: Diagnosis present

## 2013-12-10 DIAGNOSIS — G4733 Obstructive sleep apnea (adult) (pediatric): Secondary | ICD-10-CM | POA: Diagnosis present

## 2013-12-10 DIAGNOSIS — Z87442 Personal history of urinary calculi: Secondary | ICD-10-CM | POA: Diagnosis not present

## 2013-12-10 DIAGNOSIS — Z96649 Presence of unspecified artificial hip joint: Secondary | ICD-10-CM

## 2013-12-10 DIAGNOSIS — H269 Unspecified cataract: Secondary | ICD-10-CM | POA: Diagnosis present

## 2013-12-10 DIAGNOSIS — Z86010 Personal history of colonic polyps: Secondary | ICD-10-CM

## 2013-12-10 DIAGNOSIS — I10 Essential (primary) hypertension: Secondary | ICD-10-CM | POA: Diagnosis present

## 2013-12-10 DIAGNOSIS — M25552 Pain in left hip: Secondary | ICD-10-CM | POA: Diagnosis present

## 2013-12-10 DIAGNOSIS — H3530 Unspecified macular degeneration: Secondary | ICD-10-CM | POA: Diagnosis present

## 2013-12-10 DIAGNOSIS — M169 Osteoarthritis of hip, unspecified: Secondary | ICD-10-CM | POA: Diagnosis present

## 2013-12-10 HISTORY — PX: TOTAL HIP ARTHROPLASTY: SHX124

## 2013-12-10 HISTORY — DX: Calculus of kidney: N20.0

## 2013-12-10 HISTORY — DX: Pure hypercholesterolemia, unspecified: E78.00

## 2013-12-10 HISTORY — DX: Dependence on other enabling machines and devices: Z99.89

## 2013-12-10 HISTORY — DX: Obstructive sleep apnea (adult) (pediatric): G47.33

## 2013-12-10 HISTORY — DX: Cardiac murmur, unspecified: R01.1

## 2013-12-10 SURGERY — ARTHROPLASTY, HIP, TOTAL, ANTERIOR APPROACH
Anesthesia: Spinal | Site: Hip | Laterality: Left

## 2013-12-10 MED ORDER — DIPHENHYDRAMINE HCL 12.5 MG/5ML PO ELIX
12.5000 mg | ORAL_SOLUTION | ORAL | Status: DC | PRN
  Filled 2013-12-10: qty 10

## 2013-12-10 MED ORDER — SCOPOLAMINE 1 MG/3DAYS TD PT72
1.0000 | MEDICATED_PATCH | Freq: Once | TRANSDERMAL | Status: DC
  Administered 2013-12-10: 1.5 mg via TRANSDERMAL

## 2013-12-10 MED ORDER — RIVAROXABAN 10 MG PO TABS
10.0000 mg | ORAL_TABLET | Freq: Every day | ORAL | Status: DC
  Administered 2013-12-11: 10 mg via ORAL
  Filled 2013-12-10 (×2): qty 1

## 2013-12-10 MED ORDER — FLEET ENEMA 7-19 GM/118ML RE ENEM
1.0000 | ENEMA | Freq: Once | RECTAL | Status: AC | PRN
  Filled 2013-12-10: qty 1

## 2013-12-10 MED ORDER — HYDROMORPHONE HCL 2 MG PO TABS: 2.0000 mg | ORAL_TABLET | ORAL | Status: DC | PRN

## 2013-12-10 MED ORDER — CEFAZOLIN SODIUM-DEXTROSE 2-3 GM-% IV SOLR
2.0000 g | Freq: Four times a day (QID) | INTRAVENOUS | Status: AC
  Administered 2013-12-10 – 2013-12-11 (×2): 2 g via INTRAVENOUS
  Filled 2013-12-10 (×2): qty 50

## 2013-12-10 MED ORDER — SCOPOLAMINE 1 MG/3DAYS TD PT72
1.0000 | MEDICATED_PATCH | TRANSDERMAL | Status: DC
  Filled 2013-12-10: qty 1

## 2013-12-10 MED ORDER — MIDAZOLAM HCL 2 MG/2ML IJ SOLN
INTRAMUSCULAR | Status: AC
  Filled 2013-12-10: qty 2

## 2013-12-10 MED ORDER — LACTATED RINGERS IV SOLN
INTRAVENOUS | Status: DC | PRN
  Administered 2013-12-10 (×2): via INTRAVENOUS

## 2013-12-10 MED ORDER — DEXAMETHASONE SODIUM PHOSPHATE 10 MG/ML IJ SOLN
10.0000 mg | Freq: Once | INTRAMUSCULAR | Status: AC
  Administered 2013-12-11: 10 mg via INTRAVENOUS
  Filled 2013-12-10: qty 1

## 2013-12-10 MED ORDER — METHOCARBAMOL 500 MG PO TABS
ORAL_TABLET | ORAL | Status: AC
  Administered 2013-12-10: 500 mg via ORAL
  Filled 2013-12-10: qty 1

## 2013-12-10 MED ORDER — MESALAMINE 1.2 G PO TBEC
1.2000 g | DELAYED_RELEASE_TABLET | Freq: Every day | ORAL | Status: DC
  Administered 2013-12-11: 1.2 g via ORAL
  Filled 2013-12-10 (×2): qty 1

## 2013-12-10 MED ORDER — BUPIVACAINE HCL (PF) 0.25 % IJ SOLN
INTRAMUSCULAR | Status: DC | PRN
  Administered 2013-12-10: 20 mL

## 2013-12-10 MED ORDER — ACETAMINOPHEN 10 MG/ML IV SOLN
INTRAVENOUS | Status: AC
  Filled 2013-12-10: qty 100

## 2013-12-10 MED ORDER — KETOROLAC TROMETHAMINE 15 MG/ML IJ SOLN
7.5000 mg | Freq: Four times a day (QID) | INTRAMUSCULAR | Status: AC | PRN
  Filled 2013-12-10: qty 1

## 2013-12-10 MED ORDER — METOCLOPRAMIDE HCL 10 MG PO TABS: 5.0000 mg | ORAL_TABLET | Freq: Three times a day (TID) | ORAL | Status: DC | PRN

## 2013-12-10 MED ORDER — PROPOFOL 10 MG/ML IV BOLUS
INTRAVENOUS | Status: AC
  Filled 2013-12-10: qty 20

## 2013-12-10 MED ORDER — ACETAMINOPHEN 650 MG RE SUPP: 650.0000 mg | Freq: Four times a day (QID) | RECTAL | Status: DC | PRN

## 2013-12-10 MED ORDER — BUPIVACAINE HCL (PF) 0.25 % IJ SOLN
INTRAMUSCULAR | Status: AC
  Filled 2013-12-10: qty 30

## 2013-12-10 MED ORDER — ACETAMINOPHEN 325 MG PO TABS: 650.0000 mg | ORAL_TABLET | Freq: Four times a day (QID) | ORAL | Status: DC | PRN

## 2013-12-10 MED ORDER — FENTANYL CITRATE 0.05 MG/ML IJ SOLN
INTRAMUSCULAR | Status: DC | PRN
  Administered 2013-12-10: 25 ug via INTRAVENOUS
  Administered 2013-12-10: 50 ug via INTRAVENOUS
  Administered 2013-12-10 (×2): 25 ug via INTRAVENOUS

## 2013-12-10 MED ORDER — POLYETHYLENE GLYCOL 3350 17 G PO PACK
17.0000 g | PACK | Freq: Every day | ORAL | Status: DC | PRN
  Filled 2013-12-10: qty 1

## 2013-12-10 MED ORDER — CETYLPYRIDINIUM CHLORIDE 0.05 % MT LIQD
7.0000 mL | Freq: Two times a day (BID) | OROMUCOSAL | Status: DC
  Administered 2013-12-11: 7 mL via OROMUCOSAL

## 2013-12-10 MED ORDER — BUPIVACAINE IN DEXTROSE 0.75-8.25 % IT SOLN
INTRATHECAL | Status: DC | PRN
  Administered 2013-12-10: 12.5 mg via INTRATHECAL

## 2013-12-10 MED ORDER — ONDANSETRON HCL 4 MG/2ML IJ SOLN: 4.0000 mg | Freq: Four times a day (QID) | INTRAMUSCULAR | Status: DC | PRN

## 2013-12-10 MED ORDER — FENTANYL CITRATE 0.05 MG/ML IJ SOLN
INTRAMUSCULAR | Status: AC
  Filled 2013-12-10: qty 5

## 2013-12-10 MED ORDER — KETOROLAC TROMETHAMINE 30 MG/ML IJ SOLN
INTRAMUSCULAR | Status: AC
  Filled 2013-12-10: qty 1

## 2013-12-10 MED ORDER — OXYCODONE HCL 5 MG/5ML PO SOLN: 5.0000 mg | Freq: Once | ORAL | Status: DC | PRN

## 2013-12-10 MED ORDER — ONDANSETRON HCL 4 MG/2ML IJ SOLN
INTRAMUSCULAR | Status: AC
  Filled 2013-12-10: qty 2

## 2013-12-10 MED ORDER — METOCLOPRAMIDE HCL 5 MG/ML IJ SOLN
5.0000 mg | Freq: Three times a day (TID) | INTRAMUSCULAR | Status: DC | PRN
  Filled 2013-12-10: qty 2

## 2013-12-10 MED ORDER — METHOCARBAMOL 500 MG PO TABS
500.0000 mg | ORAL_TABLET | Freq: Four times a day (QID) | ORAL | Status: DC | PRN
  Administered 2013-12-10: 500 mg via ORAL
  Filled 2013-12-10 (×2): qty 1

## 2013-12-10 MED ORDER — PROMETHAZINE HCL 25 MG/ML IJ SOLN: 6.2500 mg | INTRAMUSCULAR | Status: DC | PRN

## 2013-12-10 MED ORDER — BISOPROLOL FUMARATE 5 MG PO TABS
5.0000 mg | ORAL_TABLET | Freq: Every day | ORAL | Status: DC
  Administered 2013-12-11: 5 mg via ORAL
  Filled 2013-12-10: qty 1

## 2013-12-10 MED ORDER — LACTATED RINGERS IV SOLN
INTRAVENOUS | Status: DC
  Administered 2013-12-10: 14:00:00 via INTRAVENOUS

## 2013-12-10 MED ORDER — BUPIVACAINE LIPOSOME 1.3 % IJ SUSP
INTRAMUSCULAR | Status: DC | PRN
  Administered 2013-12-10: 20 mL

## 2013-12-10 MED ORDER — HYDROMORPHONE HCL 1 MG/ML IJ SOLN
INTRAMUSCULAR | Status: AC
  Administered 2013-12-10: 0.5 mg via INTRAVENOUS
  Filled 2013-12-10: qty 1

## 2013-12-10 MED ORDER — MORPHINE SULFATE 2 MG/ML IJ SOLN: 1.0000 mg | INTRAMUSCULAR | Status: DC | PRN

## 2013-12-10 MED ORDER — MIDAZOLAM HCL 2 MG/2ML IJ SOLN: 0.5000 mg | Freq: Once | INTRAMUSCULAR | Status: DC | PRN

## 2013-12-10 MED ORDER — PROPOFOL INFUSION 10 MG/ML OPTIME
INTRAVENOUS | Status: DC | PRN
  Administered 2013-12-10: 50 ug/kg/min via INTRAVENOUS

## 2013-12-10 MED ORDER — EPHEDRINE SULFATE 50 MG/ML IJ SOLN
INTRAMUSCULAR | Status: DC | PRN
  Administered 2013-12-10 (×2): 10 mg via INTRAVENOUS
  Administered 2013-12-10: 5 mg via INTRAVENOUS
  Administered 2013-12-10 (×2): 10 mg via INTRAVENOUS

## 2013-12-10 MED ORDER — ONDANSETRON HCL 4 MG/2ML IJ SOLN
INTRAMUSCULAR | Status: DC | PRN
Start: 2013-12-10 — End: 2013-12-10
  Administered 2013-12-10: 4 mg via INTRAVENOUS

## 2013-12-10 MED ORDER — OXYCODONE HCL 5 MG PO TABS: 5.0000 mg | ORAL_TABLET | Freq: Once | ORAL | Status: DC | PRN

## 2013-12-10 MED ORDER — TRANEXAMIC ACID 100 MG/ML IV SOLN
2000.0000 mg | Freq: Once | INTRAVENOUS | Status: DC
  Filled 2013-12-10 (×3): qty 20

## 2013-12-10 MED ORDER — DOCUSATE SODIUM 100 MG PO CAPS
100.0000 mg | ORAL_CAPSULE | Freq: Two times a day (BID) | ORAL | Status: DC
  Administered 2013-12-10 – 2013-12-11 (×2): 100 mg via ORAL
  Filled 2013-12-10 (×3): qty 1

## 2013-12-10 MED ORDER — HYDROMORPHONE HCL 1 MG/ML IJ SOLN
0.2500 mg | INTRAMUSCULAR | Status: DC | PRN
  Administered 2013-12-10 (×2): 0.5 mg via INTRAVENOUS

## 2013-12-10 MED ORDER — MIDAZOLAM HCL 5 MG/5ML IJ SOLN
INTRAMUSCULAR | Status: DC | PRN
  Administered 2013-12-10 (×2): 2 mg via INTRAVENOUS

## 2013-12-10 MED ORDER — ALBUMIN HUMAN 5 % IV SOLN
INTRAVENOUS | Status: DC | PRN
  Administered 2013-12-10: 17:00:00 via INTRAVENOUS

## 2013-12-10 MED ORDER — PHENYLEPHRINE HCL 10 MG/ML IJ SOLN
10.0000 mg | INTRAVENOUS | Status: DC | PRN
  Administered 2013-12-10: 20 ug/min via INTRAVENOUS

## 2013-12-10 MED ORDER — ZOLPIDEM TARTRATE 5 MG PO TABS
5.0000 mg | ORAL_TABLET | Freq: Every evening | ORAL | Status: DC | PRN
  Administered 2013-12-10: 5 mg via ORAL
  Filled 2013-12-10: qty 1

## 2013-12-10 MED ORDER — ACETAMINOPHEN 500 MG PO TABS
1000.0000 mg | ORAL_TABLET | Freq: Four times a day (QID) | ORAL | Status: DC
  Administered 2013-12-10 – 2013-12-11 (×3): 1000 mg via ORAL
  Filled 2013-12-10 (×4): qty 2

## 2013-12-10 MED ORDER — SCOPOLAMINE 1 MG/3DAYS TD PT72
MEDICATED_PATCH | TRANSDERMAL | Status: AC
  Filled 2013-12-10: qty 1

## 2013-12-10 MED ORDER — ONDANSETRON HCL 4 MG PO TABS
4.0000 mg | ORAL_TABLET | Freq: Four times a day (QID) | ORAL | Status: DC | PRN
Start: 2013-12-10 — End: 2013-12-11
  Administered 2013-12-10: 4 mg via ORAL
  Filled 2013-12-10: qty 1

## 2013-12-10 MED ORDER — PROPOFOL 10 MG/ML IV BOLUS
INTRAVENOUS | Status: DC | PRN
  Administered 2013-12-10: 150 mg via INTRAVENOUS

## 2013-12-10 MED ORDER — DEXAMETHASONE SODIUM PHOSPHATE 10 MG/ML IJ SOLN
INTRAMUSCULAR | Status: DC | PRN
  Administered 2013-12-10: 10 mg via INTRAVENOUS

## 2013-12-10 MED ORDER — TRANEXAMIC ACID 100 MG/ML IV SOLN
2000.0000 mg | Freq: Once | INTRAVENOUS | Status: AC
  Administered 2013-12-10: 2000 mg via TOPICAL
  Filled 2013-12-10: qty 20

## 2013-12-10 MED ORDER — MENTHOL 3 MG MT LOZG
1.0000 | LOZENGE | OROMUCOSAL | Status: DC | PRN
  Filled 2013-12-10: qty 9

## 2013-12-10 MED ORDER — METHOCARBAMOL 1000 MG/10ML IJ SOLN: 500.0000 mg | Freq: Four times a day (QID) | INTRAVENOUS | Status: DC | PRN

## 2013-12-10 MED ORDER — DEXAMETHASONE SODIUM PHOSPHATE 10 MG/ML IJ SOLN
INTRAMUSCULAR | Status: AC
  Filled 2013-12-10: qty 1

## 2013-12-10 MED ORDER — ACETAMINOPHEN 10 MG/ML IV SOLN
INTRAVENOUS | Status: DC | PRN
  Administered 2013-12-10: 1000 mg via INTRAVENOUS

## 2013-12-10 MED ORDER — MEPERIDINE HCL 25 MG/ML IJ SOLN: 6.2500 mg | INTRAMUSCULAR | Status: DC | PRN

## 2013-12-10 MED ORDER — SODIUM CHLORIDE 0.9 % IV SOLN
INTRAVENOUS | Status: DC
  Administered 2013-12-10: 20:00:00 via INTRAVENOUS

## 2013-12-10 MED ORDER — 0.9 % SODIUM CHLORIDE (POUR BTL) OPTIME
TOPICAL | Status: DC | PRN
  Administered 2013-12-10: 1000 mL

## 2013-12-10 MED ORDER — SODIUM CHLORIDE 0.9 % IV SOLN: INTRAVENOUS | Status: DC

## 2013-12-10 MED ORDER — BISACODYL 10 MG RE SUPP: 10.0000 mg | Freq: Every day | RECTAL | Status: DC | PRN

## 2013-12-10 MED ORDER — CHLORHEXIDINE GLUCONATE 4 % EX LIQD
60.0000 mL | Freq: Once | CUTANEOUS | Status: DC
  Filled 2013-12-10: qty 60

## 2013-12-10 MED ORDER — PHENOL 1.4 % MT LIQD
1.0000 | OROMUCOSAL | Status: DC | PRN
  Filled 2013-12-10: qty 177

## 2013-12-10 SURGICAL SUPPLY — 41 items
BLADE SAW SGTL 18X1.27X75 (BLADE) ×2 IMPLANT
CAPT HIP TOTAL 2 ×2 IMPLANT
CLSR STERI-STRIP ANTIMIC 1/2X4 (GAUZE/BANDAGES/DRESSINGS) ×2 IMPLANT
DECANTER SPIKE VIAL GLASS SM (MISCELLANEOUS) ×2 IMPLANT
DRAPE C-ARM 42X72 X-RAY (DRAPES) ×2 IMPLANT
DRAPE IMP U-DRAPE 54X76 (DRAPES) ×2 IMPLANT
DRAPE STERI IOBAN 125X83 (DRAPES) ×2 IMPLANT
DRAPE U-SHAPE 47X51 STRL (DRAPES) ×6 IMPLANT
DRSG MEPILEX BORDER 4X4 (GAUZE/BANDAGES/DRESSINGS) ×2 IMPLANT
DRSG MEPILEX BORDER 4X8 (GAUZE/BANDAGES/DRESSINGS) ×2 IMPLANT
DURAPREP 26ML APPLICATOR (WOUND CARE) ×2 IMPLANT
ELECT BLADE 6.5 EXT (BLADE) ×2 IMPLANT
ELECT REM PT RETURN 9FT ADLT (ELECTROSURGICAL) ×2
ELECTRODE REM PT RTRN 9FT ADLT (ELECTROSURGICAL) ×1 IMPLANT
EVACUATOR 1/8 PVC DRAIN (DRAIN) ×2 IMPLANT
FACESHIELD WRAPAROUND (MASK) ×4 IMPLANT
GLOVE BIO SURGEON STRL SZ7.5 (GLOVE) ×2 IMPLANT
GLOVE BIO SURGEON STRL SZ8 (GLOVE) ×4 IMPLANT
GLOVE BIOGEL PI IND STRL 8 (GLOVE) ×2 IMPLANT
GLOVE BIOGEL PI INDICATOR 8 (GLOVE) ×2
GLOVE ECLIPSE 8.0 STRL XLNG CF (GLOVE) ×2 IMPLANT
GOWN STRL REUS W/ TWL LRG LVL3 (GOWN DISPOSABLE) ×3 IMPLANT
GOWN STRL REUS W/ TWL XL LVL3 (GOWN DISPOSABLE) ×1 IMPLANT
GOWN STRL REUS W/TWL LRG LVL3 (GOWN DISPOSABLE) ×3
GOWN STRL REUS W/TWL XL LVL3 (GOWN DISPOSABLE) ×1
KIT BASIN OR (CUSTOM PROCEDURE TRAY) ×2 IMPLANT
NDL SAFETY ECLIPSE 18X1.5 (NEEDLE) ×1 IMPLANT
NEEDLE HYPO 18GX1.5 SHARP (NEEDLE) ×1
PACK TOTAL JOINT (CUSTOM PROCEDURE TRAY) ×2 IMPLANT
PACK UNIVERSAL I (CUSTOM PROCEDURE TRAY) ×2 IMPLANT
SUT ETHIBOND NAB CT1 #1 30IN (SUTURE) ×2 IMPLANT
SUT MNCRL AB 4-0 PS2 18 (SUTURE) ×2 IMPLANT
SUT VIC AB 1 CT1 27 (SUTURE) ×1
SUT VIC AB 1 CT1 27XBRD ANTBC (SUTURE) ×1 IMPLANT
SUT VIC AB 2-0 CT1 27 (SUTURE) ×2
SUT VIC AB 2-0 CT1 TAPERPNT 27 (SUTURE) ×2 IMPLANT
SUT VLOC 180 0 24IN GS25 (SUTURE) ×2 IMPLANT
SYR 20CC LL (SYRINGE) ×2 IMPLANT
SYR 50ML LL SCALE MARK (SYRINGE) ×2 IMPLANT
TOWEL OR 17X26 10 PK STRL BLUE (TOWEL DISPOSABLE) ×4 IMPLANT
TRAY FOLEY CATH 16FR SILVER (SET/KITS/TRAYS/PACK) ×2 IMPLANT

## 2013-12-10 NOTE — Anesthesia Procedure Notes (Addendum)
Procedure Name: MAC Date/Time: 12/10/2013 3:48 PM Performed by: Garrison Columbus T Pre-anesthesia Checklist: Patient identified, Emergency Drugs available, Suction available and Patient being monitored Patient Re-evaluated:Patient Re-evaluated prior to inductionOxygen Delivery Method: Simple face mask Preoxygenation: Pre-oxygenation with 100% oxygen Intubation Type: IV induction Placement Confirmation: positive ETCO2 and breath sounds checked- equal and bilateral Dental Injury: Teeth and Oropharynx as per pre-operative assessment     Spinal Patient location during procedure: OR Start time: 12/10/2013 5:25 PM End time: 12/10/2013 3:37 PM Staffing Anesthesiologist: Midge Minium Performed by: anesthesiologist  Preanesthetic Checklist Completed: patient identified, site marked, surgical consent, pre-op evaluation, timeout performed, IV checked, risks and benefits discussed and monitors and equipment checked Spinal Block Patient position: sitting Prep: Betadine and site prepped and draped Patient monitoring: heart rate, cardiac monitor, continuous pulse ox and blood pressure Approach: midline Location: L3-4 Injection technique: single-shot Needle Needle type: Quincke  Needle gauge: 22 G (unable to enter CSF with multiple passes #25G) Needle length: 9 cm Assessment Sensory level: T8 Additional Notes Pt identified.  Monitors applied. Working IV access confirmed. Sterile prep, drape L spine.  1% lido local, unable to enter CSF multiple passes with #25 ga Quincke, #22 ga Quincke into clear CSF, asp CSF x4 quadrants, and at beginning, end of injection 12.5 mg Bupivacaine with dextrose.   Patient asymptomatic, VSS, no heme aspirated, tolerated well.  Jenita Seashore, MD  Procedure Name: LMA Insertion Date/Time: 12/10/2013 4:13 PM Performed by: Garrison Columbus T Pre-anesthesia Checklist: Patient identified, Emergency Drugs available, Suction available and Patient being monitored Patient  Re-evaluated:Patient Re-evaluated prior to inductionOxygen Delivery Method: Circle system utilized Preoxygenation: Pre-oxygenation with 100% oxygen Intubation Type: IV induction Ventilation: Mask ventilation without difficulty LMA: LMA inserted LMA Size: 4.0 Number of attempts: 1 Placement Confirmation: positive ETCO2 and breath sounds checked- equal and bilateral Tube secured with: Tape Dental Injury: Teeth and Oropharynx as per pre-operative assessment

## 2013-12-10 NOTE — Anesthesia Preprocedure Evaluation (Signed)
Anesthesia Evaluation  Patient identified by MRN, date of birth, ID band Patient awake    Reviewed: Allergy & Precautions, H&P , NPO status , Patient's Chart, lab work & pertinent test results  History of Anesthesia Complications (+) PONV and history of anesthetic complications  Airway Mallampati: I  TM Distance: >3 FB Neck ROM: Full    Dental  (+) Dental Advisory Given   Pulmonary sleep apnea and Continuous Positive Airway Pressure Ventilation , former smoker,  breath sounds clear to auscultation        Cardiovascular hypertension, Pt. on medications and Pt. on home beta blockers Rhythm:Regular Rate:Normal  9/15 ECHO: EF 60-65%, valves OK 9/15 stress test: no ischemia, normal LVF, EF 79%   Neuro/Psych negative neurological ROS     GI/Hepatic negative GI ROS, Neg liver ROS,   Endo/Other  Morbid obesity  Renal/GU negative Renal ROS     Musculoskeletal  (+) Arthritis -,   Abdominal (+) + obese,   Peds  Hematology negative hematology ROS (+)   Anesthesia Other Findings   Reproductive/Obstetrics                             Anesthesia Physical Anesthesia Plan  ASA: II  Anesthesia Plan: Spinal   Post-op Pain Management:    Induction:   Airway Management Planned: Natural Airway and Nasal Cannula  Additional Equipment:   Intra-op Plan:   Post-operative Plan:   Informed Consent:   Dental advisory given  Plan Discussed with: CRNA and Surgeon  Anesthesia Plan Comments: (Plan routine monitors, SAB)        Anesthesia Quick Evaluation

## 2013-12-10 NOTE — Op Note (Signed)
OPERATIVE REPORT  PREOPERATIVE DIAGNOSIS: Osteoarthritis of the Left hip.   POSTOPERATIVE DIAGNOSIS: Osteoarthritis of the Left  hip.   PROCEDURE: Left total hip arthroplasty, anterior approach.   SURGEON: Gaynelle Arabian, MD   ASSISTANT: Arlee Muslim, PA-C  ANESTHESIA:  General  ESTIMATED BLOOD LOSS:- 500 ml    DRAINS: Hemovac x1.   COMPLICATIONS: None   CONDITION: PACU - hemodynamically stable.   BRIEF CLINICAL NOTE: Sheila Ramsey is a 63 y.o. female who has advanced end-  stage arthritis of her Left  hip with progressively worsening pain and  dysfunction.The patient has failed nonoperative management and presents for  total hip arthroplasty.   PROCEDURE IN DETAIL: After successful administration of spinal  anesthetic, the traction boots for the [facility] bed were placed on both  feet and the patient was placed onto the [facility] bed, boots placed into the leg  holders. The Left hip was then isolated from the perineum with plastic  drapes and prepped and draped in the usual sterile fashion. ASIS and  greater trochanter were marked and a oblique incision was made, starting  at about 1 cm lateral and 2 cm distal to the ASIS and coursing towards  the anterior cortex of the femur. The skin was cut with a 10 blade  through subcutaneous tissue to the level of the fascia overlying the  tensor fascia lata muscle. The fascia was then incised in line with the  incision at the junction of the anterior third and posterior 2/3rd. The  muscle was teased off the fascia and then the interval between the TFL  and the rectus was developed. The Hohmann retractor was then placed at  the top of the femoral neck over the capsule. The vessels overlying the  capsule were cauterized and the fat on top of the capsule was removed.  A Hohmann retractor was then placed anterior underneath the rectus  femoris to give exposure to the entire anterior capsule. A T-shaped  capsulotomy was performed. The  edges were tagged and the femoral head  was identified.       Osteophytes are removed off the superior acetabulum.  The femoral neck was then cut in situ with an oscillating saw. Traction  was then applied to the left lower extremity utilizing the [facility]  traction. The femoral head was then removed. Retractors were placed  around the acetabulum and then circumferential removal of the labrum was  performed. Osteophytes were also removed. Reaming starts at 43 mm to  medialize and  Increased in 2 mm increments to 49 mm. We reamed in  approximately 40 degrees of abduction, 20 degrees anteversion. A 50 mm  pinnacle acetabular shell was then impacted in anatomic position under  fluoroscopic guidance with excellent purchase. We did not need to place  any additional dome screws. A 32 mm neutral + 4 marathon liner was then  placed into the acetabular shell.       The femoral lift was then placed along the lateral aspect of the femur  just distal to the vastus ridge. The leg was  externally rotated and capsule  was stripped off the inferior aspect of the femoral neck down to the  level of the lesser trochanter, this was done with electrocautery. The femur was lifted after this was performed. The  leg was then placed and extended in adducted position to essentially delivering the femur. We also removed the capsule superiorly and the  piriformis from  the piriformis fossa to gain excellent exposure of the  proximal femur. Rongeur was used to remove some cancellous bone to get  into the lateral portion of the proximal femur for placement of the  initial starter reamer. The starter broaches was placed  the starter broach  and was shown to go down the center of the canal. Broaching  with the  Corail system was then performed starting at size 8, coursing  Up to size 9. A size 9 had excellent torsional and rotational  and axial stability. The trial standard offset neck was then placed  with a 32 + 9 trial  head. The hip was then reduced. We confirmed that  the stem was in the canal both on AP and lateral x-rays. It also has excellent sizing. The hip was reduced with outstanding stability through full extension, full external rotation,  and then flexion in adduction internal rotation. AP pelvis was taken  and the leg lengths were measured and found to be exactly equal. Hip  was then dislocated again and the femoral head and neck removed. The  femoral broach was removed. Size 9 Corail stem with a standard offset  neck was then impacted into the femur following native anteversion. Has  excellent purchase in the canal. Excellent torsional and rotational and  axial stability. It is confirmed to be in the canal on AP and lateral  fluoroscopic views. The 32 + 9 ceramic head was placed and the hip  reduced with outstanding stability. Again AP pelvis was taken and it  confirmed that the leg lengths were equal. The wound was then copiously  irrigated with saline solution and the capsule reattached and repaired  with Ethibond suture.  20 mL of Exparel mixed with 50 mL of saline then additional 20 ml of .25% Bupivicaine injected into the capsule and into the edge of the tensor fascia lata as well as subcutaneous tissue. The fascia overlying the tensor fascia lata was  then closed with a running #1 V-Loc. Subcu was closed with interrupted  2-0 Vicryl and subcuticular running 4-0 Monocryl. Incision was cleaned  and dried. Steri-Strips and a bulky sterile dressing applied. Hemovac  drain was hooked to suction and then he was awakened and transported to  recovery in stable condition.        Please note that a surgical assistant was a medical necessity for this procedure to perform it in a safe and expeditious manner. Assistant was necessary to provide appropriate retraction of vital neurovascular structures and to prevent femoral fracture and allow for anatomic placement of the prosthesis.  Gaynelle Arabian, M.D.

## 2013-12-10 NOTE — Interval H&P Note (Signed)
History and Physical Interval Note:  12/10/2013 2:37 PM  Sheila Ramsey  has presented today for surgery, with the diagnosis of left hip osteoarthritis  The various methods of treatment have been discussed with the patient and family. After consideration of risks, benefits and other options for treatment, the patient has consented to  Procedure(s): LEFT TOTAL HIP ARTHROPLASTY ANTERIOR APPROACH (Left) as a surgical intervention .  The patient's history has been reviewed, patient examined, no change in status, stable for surgery.  I have reviewed the patient's chart and labs.  Questions were answered to the patient's satisfaction.     Gearlean Alf

## 2013-12-10 NOTE — Transfer of Care (Signed)
Immediate Anesthesia Transfer of Care Note  Patient: Sheila Ramsey  Procedure(s) Performed: Procedure(s): LEFT TOTAL HIP ARTHROPLASTY ANTERIOR APPROACH (Left)  Patient Location: PACU  Anesthesia Type:General and Spinal  Level of Consciousness: awake and alert   Airway & Oxygen Therapy: Patient Spontanous Breathing and Patient connected to nasal cannula oxygen  Post-op Assessment: Report given to PACU RN, Post -op Vital signs reviewed and stable and Patient moving all extremities X 4  Post vital signs: Reviewed and stable  Complications: No apparent anesthesia complications

## 2013-12-11 ENCOUNTER — Encounter (HOSPITAL_COMMUNITY): Payer: Self-pay | Admitting: Orthopedic Surgery

## 2013-12-11 LAB — BASIC METABOLIC PANEL
ANION GAP: 12 (ref 5–15)
BUN: 11 mg/dL (ref 6–23)
CHLORIDE: 104 meq/L (ref 96–112)
CO2: 24 meq/L (ref 19–32)
CREATININE: 0.69 mg/dL (ref 0.50–1.10)
Calcium: 9 mg/dL (ref 8.4–10.5)
GFR calc Af Amer: >90 mL/min (ref >90)
GFR calc non Af Amer: >90 mL/min (ref >90)
Glucose, Bld: 155 mg/dL — ABNORMAL HIGH (ref 70–99)
Potassium: 4.8 mEq/L (ref 3.7–5.3)
SODIUM: 140 meq/L (ref 137–147)

## 2013-12-11 LAB — CBC
HCT: 37.5 % (ref 36.0–46.0)
Hemoglobin: 12.5 g/dL (ref 12.0–15.0)
MCH: 29.7 pg (ref 26.0–34.0)
MCHC: 33.3 g/dL (ref 30.0–36.0)
MCV: 89.1 fL (ref 78.0–100.0)
Platelets: 246 10*3/uL (ref 150–400)
RBC: 4.21 MIL/uL (ref 3.87–5.11)
RDW: 12.9 % (ref 11.5–15.5)
WBC: 8.6 10*3/uL (ref 4.0–10.5)

## 2013-12-11 MED ORDER — TRAMADOL HCL 50 MG PO TABS: 50.0000 mg | ORAL_TABLET | Freq: Four times a day (QID) | ORAL | Status: DC | PRN

## 2013-12-11 MED ORDER — METHOCARBAMOL 500 MG PO TABS: 500.0000 mg | ORAL_TABLET | Freq: Four times a day (QID) | ORAL | Status: DC | PRN

## 2013-12-11 NOTE — Evaluation (Signed)
Physical Therapy Evaluation Patient Details Name: Sheila Ramsey MRN: [id number] DOB: August 27, 1950 Today's Date: 12/11/2013   History of Present Illness  62 y.o. s/p LEFT TOTAL HIP ARTHROPLASTY ANTERIOR APPROACH  Clinical Impression  Pt admitted with/for L THA.  Pt currently limited functionally due to the problems listed below.  (see problems list.)  Pt will benefit from PT to maximize function and safety to be able to get home safely with available assist of family.     Follow Up Recommendations Home health PT    Equipment Recommendations       Recommendations for Other Services       Precautions / Restrictions Precautions Precautions: Anterior Hip;Fall Precaution Booklet Issued: No Restrictions Weight Bearing Restrictions: Yes LLE Weight Bearing: Weight bearing as tolerated      Mobility  Bed Mobility Overal bed mobility: Needs Assistance Bed Mobility: Supine to Sit     Supine to sit: Supervision     General bed mobility comments: not assessed  Transfers Overall transfer level: Needs assistance Equipment used: Rolling walker (2 wheeled) Transfers: Sit to/from Stand Sit to Stand: Supervision            Ambulation/Gait Ambulation/Gait assistance: Supervision Ambulation Distance (Feet): 300 Feet Assistive device: Rolling walker (2 wheeled) Gait Pattern/deviations: Step-through pattern;Step-to pattern Gait velocity: moderate   General Gait Details: Step to progressing to step through gait.  Well sequenced  Stairs            Wheelchair Mobility    Modified Rankin (Stroke Patients Only)       Balance Overall balance assessment: No apparent balance deficits (not formally assessed)                                           Pertinent Vitals/Pain Pain Assessment: 0-10 Pain Score: 5  Pain Location: L hip Pain Descriptors / Indicators: Aching Pain Intervention(s): Repositioned;Monitored during session    Home Living  Family/patient expects to be discharged to:: Private residence Living Arrangements: Spouse/significant other Available Help at Discharge: Family;Available PRN/intermittently Type of Home: House Home Access: Level entry     Home Layout: Two level;Able to live on main level with bedroom/bathroom Home Equipment: Kasandra Knudsen - single point;Walker - 4 wheels;Shower seat;Bedside commode Additional Comments: discussed with pt moving large furniture piece out of bathroom to make it more walker accessible, pt had been using piece to assist with toilet transfers PTA, discussed use of rw and BSC over toilet to assist with toilet transfers at d/c    Prior Function Level of Independence: Independent      ADL's / Homemaking Assistance Needed: assist with LB dressing at times  Comments: drives, works as a Scientist, physiological   Dominant Hand: Right    Extremity/Trunk Assessment   Upper Extremity Assessment: Defer to OT evaluation           Lower Extremity Assessment: Overall WFL for tasks assessed;LLE deficits/detail   LLE Deficits / Details: moved well against gravity, but weaker due to pain     Communication   Communication: No difficulties  Cognition Arousal/Alertness: Awake/alert Behavior During Therapy: WFL for tasks assessed/performed Overall Cognitive Status: Within Functional Limits for tasks assessed                      General Comments      Exercises Total Joint Exercises  Ankle Circles/Pumps: AROM;Both;15 reps;Seated Quad Sets: AROM;Both;10 reps;Supine Heel Slides: AROM;Left;10 reps;Supine Hip ABduction/ADduction: AROM;10 reps;Left;Supine Straight Leg Raises: AAROM;Left;10 reps;Supine      Assessment/Plan    PT Assessment Patient needs continued PT services  PT Diagnosis Acute pain   PT Problem List Decreased strength;Decreased activity tolerance;Decreased knowledge of precautions;Pain  PT Treatment Interventions Gait training;DME  instruction;Functional mobility training;Therapeutic activities;Patient/family education;Therapeutic exercise;Stair training   PT Goals (Current goals can be found in the Care Plan section) Acute Rehab PT Goals Patient Stated Goal: Back to independence PT Goal Formulation: With patient Time For Goal Achievement: 12/18/13 Potential to Achieve Goals: Good    Frequency Min 5X/week   Barriers to discharge        Co-evaluation               End of Session   Activity Tolerance: Patient tolerated treatment well Patient left: in chair;with call bell/phone within reach Nurse Communication: Mobility status         Time: 1034-1101 PT Time Calculation (min) (ACUTE ONLY): 27 min   Charges:   PT Evaluation $Initial PT Evaluation Tier I: 1 Procedure PT Treatments $Gait Training: 8-22 mins   PT G Codes:          Angelgabriel Willmore, Tessie Fass 12/11/2013, 12:22 PM 12/11/2013  Donnella Sham, PT [phone]  (pager)

## 2013-12-11 NOTE — Care Management Note (Signed)
    Page 1 of 1   12/11/2013     2:21:04 PM CARE MANAGEMENT NOTE 12/11/2013  Patient:  Sheila Ramsey, Sheila Ramsey   Account Number:  [id number]  Date Initiated:  12/11/2013  Documentation initiated by:  Tomi Bamberger  Subjective/Objective Assessment:   dx s/p hip arthroplasty  admit- lives with spouse.  Patient states she has rolling walker and a 3 n 1 at home already.     Action/Plan:   Anticipated DC Date:  12/11/2013   Anticipated DC Plan:  [place]  CM consult      [facility] Choice  HOME HEALTH   Choice offered to / List presented to:  C-1 Patient        [place] arranged  HH-2 PT      [place]   Status of service:  Completed, signed off Medicare Important Message given?  NO (If response is "NO", the following Medicare IM given date fields will be blank) Date Medicare IM given:   Medicare IM given by:   Date Additional Medicare IM given:   Additional Medicare IM given by:    Discharge Disposition:  [place]  Per UR Regulation:  Reviewed for med. necessity/level of care/duration of stay  If discussed at [place] of Stay Meetings, dates discussed:    Comments:  12/11/13 1418 Theodosia Paling RN, BSN [phone] patient for dc today, patient states she has a rolling walker and 3 n 1 at home.  Patient was preset up with [facility] for HHPT, [facility] informed of dc today.  Soc will begin 24-48 hrs post dc.

## 2013-12-11 NOTE — Progress Notes (Signed)
Physical Therapy Treatment Patient Details Name: Sheila Ramsey MRN: [id number] DOB: December 19, 1950 Today's Date: 12/11/2013    History of Present Illness 63 y.o. s/p LEFT TOTAL HIP ARTHROPLASTY ANTERIOR APPROACH    PT Comments    Pt progressing faster than average.  More painful and stiff this afternoon, however still ready for d/c today.   Follow Up Recommendations  Home health PT     Equipment Recommendations  None recommended by PT    Recommendations for Other Services       Precautions / Restrictions Precautions Precautions: Anterior Hip;Fall Precaution Booklet Issued: No Restrictions Weight Bearing Restrictions: Yes LLE Weight Bearing: Weight bearing as tolerated    Mobility  Bed Mobility Overal bed mobility: Needs Assistance Bed Mobility: Supine to Sit     Supine to sit: Min guard     General bed mobility comments: more painful and need guard assist  Transfers Overall transfer level: Needs assistance Equipment used: Rolling walker (2 wheeled) Transfers: Sit to/from Stand Sit to Stand: Min guard         General transfer comment: cues for hand placement  Ambulation/Gait Ambulation/Gait assistance: Supervision Ambulation Distance (Feet): 380 Feet Assistive device: Rolling walker (2 wheeled) Gait Pattern/deviations: Step-through pattern Gait velocity: moderate   General Gait Details: More stiff and slower than this morning   Stairs Stairs: Yes Stairs assistance: Min guard Stair Management: One rail Left;With walker;Step to pattern;Forwards (Folded walker to simulate home stair situation) Number of Stairs: 6 General stair comments: safe with rail  Wheelchair Mobility    Modified Rankin (Stroke Patients Only)       Balance Overall balance assessment: No apparent balance deficits (not formally assessed)                                  Cognition Arousal/Alertness: Awake/alert Behavior During Therapy: WFL for tasks  assessed/performed Overall Cognitive Status: Within Functional Limits for tasks assessed                      Exercises Total Joint Exercises Ankle Circles/Pumps: AROM;Both;15 reps;Seated Quad Sets: AROM;Both;10 reps;Supine Heel Slides: AROM;Left;10 reps;Supine Hip ABduction/ADduction: AROM;10 reps;Left;Supine Straight Leg Raises: AAROM;Left;10 reps;Supine    General Comments        Pertinent Vitals/Pain Pain Assessment: 0-10 Pain Score: 6  Pain Location: L hip Pain Descriptors / Indicators: Aching;Burning Pain Intervention(s): Limited activity within patient's tolerance    Home Living Family/patient expects to be discharged to:: Private residence Living Arrangements: Spouse/significant other Available Help at Discharge: Family Type of Home: House Home Access: Level entry   Home Layout: Two level;Able to live on main level with bedroom/bathroom Home Equipment: Kasandra Knudsen - single point;Walker - 4 wheels;Shower seat;Bedside commode Additional Comments: discussed with pt moving large furniture piece out of bathroom to make it more walker accessible, pt had been using piece to assist with toilet transfers PTA, discussed use of rw and BSC over toilet to assist with toilet transfers at d/c    Prior Function Level of Independence: Needs assistance    ADL's / Homemaking Assistance Needed: assist with LB dressing at times Comments: drives, works as a Hydrographic surveyor Goals (current goals can now be found in the care plan section) Acute Rehab PT Goals Patient Stated Goal: Back to independence PT Goal Formulation: With patient Time For Goal Achievement: 12/18/13 Potential to Achieve Goals: Good    Frequency  Min 5X/week  PT Plan Current plan remains appropriate    Co-evaluation             End of Session   Activity Tolerance: Patient tolerated treatment well Patient left: in bed;with call bell/phone within reach (sitting EOB)     Time: 5015-8682 PT Time  Calculation (min) (ACUTE ONLY): 27 min  Charges:  $Gait Training: 8-22 mins $Therapeutic Activity: 8-22 mins                    G Codes:      Thelmer Legler, Tessie Fass 12/11/2013, 3:50 PM 12/11/2013  Donnella Sham, PT [phone]  (pager)

## 2013-12-11 NOTE — Progress Notes (Signed)
Nsg Discharge Note  Admit Date:  12/10/2013 Discharge date: 12/11/2013   Sheila Ramsey to be D/C'd Home per MD order.  AVS completed.  Copy for chart, and copy for patient signed, and dated. Patient/caregiver able to verbalize understanding.  Discharge Medication:   Medication List    TAKE these medications        bisoprolol 5 MG tablet  Commonly known as:  ZEBETA  Take 5 mg by mouth daily.     HYDROmorphone 2 MG tablet  Commonly known as:  DILAUDID  Take 1-2 tablets (2-4 mg total) by mouth every 4 (four) hours as needed for severe pain.     mesalamine 1.2 G EC tablet  Commonly known as:  LIALDA  Take 1.2 g by mouth daily with breakfast.     methocarbamol 500 MG tablet  Commonly known as:  ROBAXIN  Take 1 tablet (500 mg total) by mouth every 6 (six) hours as needed for muscle spasms.     ondansetron 4 MG tablet  Commonly known as:  ZOFRAN  Take 1 tablet (4 mg total) by mouth every 6 (six) hours as needed for nausea.     rivaroxaban 10 MG Tabs tablet  Commonly known as:  XARELTO  Take 1 tablet (10 mg total) by mouth daily with breakfast.     traMADol 50 MG tablet  Commonly known as:  ULTRAM  Take 1-2 tablets (50-100 mg total) by mouth every 6 (six) hours as needed for moderate pain.     zolpidem 10 MG tablet  Commonly known as:  AMBIEN  Take 5 mg by mouth at bedtime as needed for sleep.        Discharge Assessment: Filed Vitals:   12/11/13 1541  BP:   Pulse:   Temp: 98 F (36.7 C)  Resp:    Skin clean, dry and intact without evidence of skin break down, no evidence of skin tears noted. IV catheter discontinued intact. Site without signs and symptoms of complications - no redness or edema noted at insertion site, patient denies c/o pain - only slight tenderness at site.  Dressing with slight pressure applied.  D/c Instructions-Education: Discharge instructions given to patient/family with verbalized understanding. D/c education completed with patient/family  including follow up instructions, medication list, d/c activities limitations if indicated, with other d/c instructions as indicated by MD - patient able to verbalize understanding, all questions fully answered. Patient instructed to return to ED, call 911, or call MD for any changes in condition.  Patient escorted via [place], and D/C home via private auto.  Dayle Points, RN 12/11/2013 4:02 PM

## 2013-12-11 NOTE — Discharge Instructions (Signed)
Dr. Gaynelle Arabian Total Joint Specialist [facility] [address] [place],  [address] [phone]    ANTERIOR APPROACH TOTAL HIP REPLACEMENT POSTOPERATIVE DIRECTIONS   Hip Rehabilitation, Guidelines Following Surgery  The results of a hip operation are greatly improved after range of motion and muscle strengthening exercises. Follow all safety measures which are given to protect your hip. If any of these exercises cause increased pain or swelling in your joint, decrease the amount until you are comfortable again. Then slowly increase the exercises. Call your caregiver if you have problems or questions.  HOME CARE INSTRUCTIONS  Most of the following instructions are designed to prevent the dislocation of your new hip.  Remove items at home which could result in a fall. This includes throw rugs or furniture in walking pathways.  Continue medications as instructed at time of discharge.  You may have some home medications which will be placed on hold until you complete the course of blood thinner medication.  You may start showering once you are discharged home but do not submerge the incision under water. Just pat the incision dry and apply a dry gauze dressing on daily. Do not put on socks or shoes without following the instructions of your caregivers.  Sit on high chairs which makes it easier to stand.  Sit on chairs with arms. Use the chair arms to help push yourself up when arising.  Keep your leg on the side of the operation out in front of you when standing up.  Arrange for the use of a toilet seat elevator so you are not sitting low.    Walk with walker as instructed.  You may resume a sexual relationship in one month or when given the OK by your caregiver.  Use walker as long as suggested by your caregivers.  You may put full weight on your legs and walk as much as is comfortable. Avoid periods of inactivity such as sitting longer than an hour  when not asleep. This helps prevent blood clots.  You may return to work once you are cleared by Engineer, production.  Do not drive a car for 6 weeks or until released by your surgeon.  Do not drive while taking narcotics.  Wear elastic stockings for three weeks following surgery during the day but you may remove then at night.  Make sure you keep all of your appointments after your operation with all of your doctors and caregivers. You should call the office at the above phone number and make an appointment for approximately two weeks after the date of your surgery. Change the dressing daily and reapply a dry dressing each time. Please pick up a stool softener and laxative for home use as long as you are requiring pain medications.  ICE to the affected hip every three hours for 30 minutes at a time and then as needed for pain and swelling.  Continue to use ice on the hip for pain and swelling from surgery. You may notice swelling that will progress down to the foot and ankle.  This is normal after surgery.  Elevate the leg when you are not up walking on it.   It is important for you to complete the blood thinner medication as prescribed by your doctor.  Continue to use the breathing machine which will help keep your temperature down.  It is common for your temperature to cycle up and down following surgery, especially at night when you are not up moving around  and exerting yourself.  The breathing machine keeps your lungs expanded and your temperature down.  RANGE OF MOTION AND STRENGTHENING EXERCISES  These exercises are designed to help you keep full movement of your hip joint. Follow your caregiver's or physical therapist's instructions. Perform all exercises about fifteen times, three times per day or as directed. Exercise both hips, even if you have had only one joint replacement. These exercises can be done on a training (exercise) mat, on the floor, on a table or on a bed. Use whatever works the best  and is most comfortable for you. Use music or television while you are exercising so that the exercises are a pleasant break in your day. This will make your life better with the exercises acting as a break in routine you can look forward to.  Lying on your back, slowly slide your foot toward your buttocks, raising your knee up off the floor. Then slowly slide your foot back down until your leg is straight again.  Lying on your back spread your legs as far apart as you can without causing discomfort.  Lying on your side, raise your upper leg and foot straight up from the floor as far as is comfortable. Slowly lower the leg and repeat.  Lying on your back, tighten up the muscle in the front of your thigh (quadriceps muscles). You can do this by keeping your leg straight and trying to raise your heel off the floor. This helps strengthen the largest muscle supporting your knee.  Lying on your back, tighten up the muscles of your buttocks both with the legs straight and with the knee bent at a comfortable angle while keeping your heel on the floor.   SKILLED REHAB INSTRUCTIONS: If the patient is transferred to a skilled rehab facility following release from the hospital, a list of the current medications will be sent to the facility for the patient to continue.  When discharged from the skilled rehab facility, please have the facility set up the patient's [place] prior to being released. Also, the skilled facility will be responsible for providing the patient with their medications at time of release from the facility to include their pain medication, the muscle relaxants, and their blood thinner medication. If the patient is still at the rehab facility at time of the two week follow up appointment, the skilled rehab facility will also need to assist the patient in arranging follow up appointment in our office and any transportation needs.  MAKE SURE YOU:  Understand these instructions.    Will watch your condition.  Will get help right away if you are not doing well or get worse.  Pick up stool softner and laxative for home. Do not submerge incision under water. May shower. Continue to use ice for pain and swelling from surgery. Total Hip Protocol.  Xarelto x 7 days then 325 mg ASA twice a day x 14 days then 81 mg ASA daily

## 2013-12-11 NOTE — Evaluation (Signed)
Occupational Therapy Evaluation Patient Details Name: Sheila Ramsey MRN: [id number] DOB: Sep 27, 1950 Today's Date: 12/11/2013    History of Present Illness 63 y.o. s/p LEFT TOTAL HIP ARTHROPLASTY ANTERIOR APPROACH   Clinical Impression   Pt s/p above. Education provided during session. Pt verbalized understanding. OT signing off.     Follow Up Recommendations  No OT follow up;Supervision - Intermittent    Equipment Recommendations  Other (comment) (sockaid)    Recommendations for Other Services       Precautions / Restrictions Precautions Precautions: Anterior Hip;Fall (no hip precautions) Precaution Booklet Issued: No Restrictions Weight Bearing Restrictions: Yes LLE Weight Bearing: Weight bearing as tolerated      Mobility Bed Mobility     General bed mobility comments: not assessed  Transfers Overall transfer level: Needs assistance Equipment used: Rolling walker (2 wheeled) Transfers: Sit to/from Stand Sit to Stand: Min guard                   ADL Overall ADL's : Needs assistance/impaired                     Lower Body Dressing: Minimal assistance;Sit to/from stand (used sockaid for sock)   Toilet Transfer: Min guard;Ambulation;RW (chair)           Functional mobility during ADLs: Min guard;Rolling walker General ADL Comments: Educated on LB dressing technique. Educated on safety (safe shoewear, use of bag on walker, rugs). Recommended spouse being with her for tub or shower transfer. Recommended sitting for most of LB ADLs. Attempted to start to try to step over simulated tub but pt uncomfortable so did not step over. Educated on different techniques for tub and shower transfer/options for shower chair. Recmmended not stepping over tub for a little while. Educated on AE-pt practiced with sockaid.     Vision                     Perception     Praxis      Pertinent Vitals/Pain Pain Assessment: 0-10 Pain Score: 5  Pain  Location: back and left hip when moving Pain Intervention(s): Repositioned;Monitored during session     Hand Dominance Right   Extremity/Trunk Assessment Upper Extremity Assessment Upper Extremity Assessment: Overall WFL for tasks assessed   Lower Extremity Assessment Lower Extremity Assessment: Defer to PT evaluation LLE Deficits / Details: moved well against gravity, but weaker due to pain       Communication Communication Communication: No difficulties   Cognition Arousal/Alertness: Awake/alert Behavior During Therapy: WFL for tasks assessed/performed Overall Cognitive Status: Within Functional Limits for tasks assessed                     General Comments          Shoulder Instructions      Home Living Family/patient expects to be discharged to:: Private residence Living Arrangements: Spouse/significant other Available Help at Discharge: Family Type of Home: House Home Access: Level entry     Home Layout: Two level;Able to live on main level with bedroom/bathroom Alternate Level Stairs-Number of Steps: flight Alternate Level Stairs-Rails: Left Bathroom Shower/Tub: Walk-in shower;Tub/shower unit Shower/tub characteristics:  (door on walk in shower) Bathroom Toilet: [place] - single point;Walker - 4 wheels;Shower seat;Bedside commode   Additional Comments: discussed with pt moving large furniture piece out of bathroom to make it more walker accessible, pt had been using piece to assist with  toilet transfers PTA, discussed use of rw and BSC over toilet to assist with toilet transfers at d/c      Prior Functioning/Environment Level of Independence: Needs assistance    ADL's / Homemaking Assistance Needed: assist with LB dressing at times   Comments: drives, works as a Neurosurgeon Diagnosis: Acute pain   OT Problem List:     OT Treatment/Interventions:      OT Goals(Current goals can be found in the care plan section)    OT Frequency:     Barriers to D/C:            Co-evaluation              End of Session Equipment Utilized During Treatment: Gait belt  Activity Tolerance: Patient tolerated treatment well Patient left: in chair;with call bell/phone within reach   Time: 1127-1153 OT Time Calculation (min): 26 min Charges:  OT General Charges $OT Visit: 1 Procedure OT Evaluation $Initial OT Evaluation Tier I: 1 Procedure OT Treatments $Self Care/Home Management : 8-22 mins G-CodesBenito Mccreedy OTR/L [phone] 12/11/2013, 1:29 PM

## 2013-12-11 NOTE — Progress Notes (Signed)
   Subjective: 1 Day Post-Op Procedure(s) (LRB): LEFT TOTAL HIP ARTHROPLASTY ANTERIOR APPROACH (Left) Patient reports pain as mild.   Plan is to go Home after hospital stay.  Objective: Vital signs in last 24 hours: Temp:  [97.3 F (36.3 C)-98.6 F (37 C)] 97.7 F (36.5 C) (12/09 0551) Pulse Rate:  [56-84] 58 (12/09 0551) Resp:  [7-24] 16 (12/09 0551) BP: (117-178)/(51-76) 117/54 mmHg (12/09 0551) SpO2:  [94 %-100 %] 95 % (12/09 0551) Weight:  [197 lb (89.359 kg)] 197 lb (89.359 kg) (12/08 1253)  Intake/Output from previous day:  Intake/Output Summary (Last 24 hours) at 12/11/13 0718 Last data filed at 12/11/13 0549  Gross per 24 hour  Intake   1650 ml  Output   2150 ml  Net   -500 ml    Intake/Output this shift:    Labs:  Recent Labs  12/11/13 0542  HGB 12.5    Recent Labs  12/11/13 0542  WBC 8.6  RBC 4.21  HCT 37.5  PLT 246    Recent Labs  12/11/13 0542  NA 140  K 4.8  CL 104  CO2 24  BUN 11  CREATININE 0.69  GLUCOSE 155*  CALCIUM 9.0   No results for input(s): LABPT, INR in the last 72 hours.  EXAM General - Patient is Alert, Appropriate and Oriented Extremity - Neurologically intact Neurovascular intact No cellulitis present Compartment soft Dressing - dressing C/D/I Motor Function - intact, moving foot and toes well on exam.  Hemovac pulled without difficulty.  Past Medical History  Diagnosis Date  . Hypertension   . Macular degeneration   . Cataract   . Diverticular disease   . PONV (postoperative nausea and vomiting)   . High cholesterol   . Heart murmur   . OSA on CPAP   . Arthritis     "hips, knees" (12/10/2013)  . Kidney stones     Assessment/Plan: 1 Day Post-Op Procedure(s) (LRB): LEFT TOTAL HIP ARTHROPLASTY ANTERIOR APPROACH (Left) Principal Problem:   OA (osteoarthritis) of hip   Advance diet Up with therapy D/C IV fluids Discharge home with home health  DVT Prophylaxis - Xarelto x 7 days then 325 mg ASA  bid x 14 days then 81 mg ASA daily Weight Bearing As Tolerated left Leg D/C Knee Immobilizer Hemovac Pulled Begin Therapy Hip Preacutions Keep foley until tomorrow. No vaccines.  Gearlean Alf

## 2013-12-11 NOTE — Plan of Care (Signed)
Problem: Phase I Progression Outcomes Goal: Pain controlled with appropriate interventions Outcome: Progressing Goal: Incision/dressings dry and intact Outcome: Progressing Goal: Tubes/drains patent Outcome: Completed/Met Date Met:  12/11/13 Goal: Initial discharge plan identified Outcome: Progressing Goal: Vital signs/hemodynamically stable Outcome: Progressing Goal: Other Phase I Outcomes/Goals Outcome: Not Applicable Date Met:  69/67/89

## 2013-12-23 NOTE — Anesthesia Postprocedure Evaluation (Signed)
  Anesthesia Post-op Note  Patient: Sheila Ramsey  Procedure(s) Performed: Procedure(s): LEFT TOTAL HIP ARTHROPLASTY ANTERIOR APPROACH (Left)  Patient was seen post-operatively by Dr. Marcie Bal.  Pt discharged with no apparent anesthetic complications.

## 2014-01-14 NOTE — Discharge Summary (Signed)
Physician Discharge Summary   Patient ID: Sheila Ramsey MRN: [id number] DOB/AGE: July 23, 1950 64 y.o.  Admit date: 12/10/2013 Discharge date: 12/11/2013  Primary Diagnosis:  Osteoarthritis of the Left hip.  Admission Diagnoses:  Past Medical History  Diagnosis Date  . Hypertension   . Macular degeneration   . Cataract   . Diverticular disease   . PONV (postoperative nausea and vomiting)   . High cholesterol   . Heart murmur   . OSA on CPAP   . Arthritis     "hips, knees" (12/10/2013)  . Kidney stones    Discharge Diagnoses:   Principal Problem:   OA (osteoarthritis) of hip  Estimated body mass index is 34.91 kg/(m^2) as calculated from the following:   Height as of this encounter: _0  (1.6 m).   Weight as of this encounter: 89.359 kg (197 lb).  Procedure(s) (LRB): LEFT TOTAL HIP ARTHROPLASTY ANTERIOR APPROACH (Left)   Consults: None  HPI: Sheila Ramsey is a 64 y.o. female who has advanced end-  stage arthritis of her Left hip with progressively worsening pain and  dysfunction.The patient has failed nonoperative management and presents for  total hip arthroplasty.  Laboratory Data: Admission on 12/10/2013, Discharged on 12/11/2013  Component Date Value Ref Range Status  . WBC 12/11/2013 8.6  4.0 - 10.5 K/uL Final  . RBC 12/11/2013 4.21  3.87 - 5.11 MIL/uL Final  . Hemoglobin 12/11/2013 12.5  12.0 - 15.0 g/dL Final  . HCT 12/11/2013 37.5  36.0 - 46.0 % Final  . MCV 12/11/2013 89.1  78.0 - 100.0 fL Final  . MCH 12/11/2013 29.7  26.0 - 34.0 pg Final  . MCHC 12/11/2013 33.3  30.0 - 36.0 g/dL Final  . RDW 12/11/2013 12.9  11.5 - 15.5 % Final  . Platelets 12/11/2013 246  150 - 400 K/uL Final  . Sodium 12/11/2013 140  137 - 147 mEq/L Final  . Potassium 12/11/2013 4.8  3.7 - 5.3 mEq/L Final  . Chloride 12/11/2013 104  96 - 112 mEq/L Final  . CO2 12/11/2013 24  19 - 32 mEq/L Final  . Glucose, Bld 12/11/2013 155* 70 - 99 mg/dL Final  . BUN 12/11/2013 11  6 - 23 mg/dL  Final  . Creatinine, Ser 12/11/2013 0.69  0.50 - 1.10 mg/dL Final  . Calcium 12/11/2013 9.0  8.4 - 10.5 mg/dL Final  . GFR calc non Af Amer 12/11/2013 >90  >90 mL/min Final  . GFR calc Af Amer 12/11/2013 >90  >90 mL/min Final   Comment: (NOTE) The eGFR has been calculated using the CKD EPI equation. This calculation has not been validated in all clinical situations. eGFR's persistently <90 mL/min signify possible Chronic Kidney Disease.   Georgiann Hahn gap 12/11/2013 12  5 - 15 Final  Hospital Outpatient Visit on 12/02/2013  Component Date Value Ref Range Status  . aPTT 12/02/2013 29  24 - 37 seconds Final  . WBC 12/02/2013 5.2  4.0 - 10.5 K/uL Final  . RBC 12/02/2013 4.90  3.87 - 5.11 MIL/uL Final  . Hemoglobin 12/02/2013 14.5  12.0 - 15.0 g/dL Final  . HCT 12/02/2013 43.8  36.0 - 46.0 % Final  . MCV 12/02/2013 89.4  78.0 - 100.0 fL Final  . MCH 12/02/2013 29.6  26.0 - 34.0 pg Final  . MCHC 12/02/2013 33.1  30.0 - 36.0 g/dL Final  . RDW 12/02/2013 12.8  11.5 - 15.5 % Final  . Platelets 12/02/2013 304  150 - 400 K/uL Final  .  Sodium 12/02/2013 141  137 - 147 mEq/L Final  . Potassium 12/02/2013 4.0  3.7 - 5.3 mEq/L Final  . Chloride 12/02/2013 101  96 - 112 mEq/L Final  . CO2 12/02/2013 29  19 - 32 mEq/L Final  . Glucose, Bld 12/02/2013 133* 70 - 99 mg/dL Final  . BUN 12/02/2013 21  6 - 23 mg/dL Final  . Creatinine, Ser 12/02/2013 0.84  0.50 - 1.10 mg/dL Final  . Calcium 12/02/2013 9.7  8.4 - 10.5 mg/dL Final  . Total Protein 12/02/2013 7.2  6.0 - 8.3 g/dL Final  . Albumin 12/02/2013 3.7  3.5 - 5.2 g/dL Final  . AST 12/02/2013 16  0 - 37 U/L Final  . ALT 12/02/2013 16  0 - 35 U/L Final  . Alkaline Phosphatase 12/02/2013 78  39 - 117 U/L Final  . Total Bilirubin 12/02/2013 0.3  0.3 - 1.2 mg/dL Final  . GFR calc non Af Amer 12/02/2013 73* >90 mL/min Final  . GFR calc Af Amer 12/02/2013 85* >90 mL/min Final   Comment: (NOTE) The eGFR has been calculated using the CKD EPI  equation. This calculation has not been validated in all clinical situations. eGFR's persistently <90 mL/min signify possible Chronic Kidney Disease.   . Anion gap 12/02/2013 11  5 - 15 Final  . Prothrombin Time 12/02/2013 13.5  11.6 - 15.2 seconds Final  . INR 12/02/2013 1.02  0.00 - 1.49 Final  . ABO/RH(D) 12/02/2013 B POS   Final  . Antibody Screen 12/02/2013 NEG   Final  . Sample Expiration 12/02/2013 12/16/2013   Final  . Color, Urine 12/02/2013 YELLOW  YELLOW Final  . APPearance 12/02/2013 CLOUDY* CLEAR Final  . Specific Gravity, Urine 12/02/2013 1.028  1.005 - 1.030 Final  . pH 12/02/2013 5.5  5.0 - 8.0 Final  . Glucose, UA 12/02/2013 NEGATIVE  NEGATIVE mg/dL Final  . Hgb urine dipstick 12/02/2013 NEGATIVE  NEGATIVE Final  . Bilirubin Urine 12/02/2013 NEGATIVE  NEGATIVE Final  . Ketones, ur 12/02/2013 NEGATIVE  NEGATIVE mg/dL Final  . Protein, ur 12/02/2013 NEGATIVE  NEGATIVE mg/dL Final  . Urobilinogen, UA 12/02/2013 0.2  0.0 - 1.0 mg/dL Final  . Nitrite 12/02/2013 NEGATIVE  NEGATIVE Final  . Leukocytes, UA 12/02/2013 NEGATIVE  NEGATIVE Final   MICROSCOPIC NOT DONE ON URINES WITH NEGATIVE PROTEIN, BLOOD, LEUKOCYTES, NITRITE, OR GLUCOSE <1000 mg/dL.  Marland Kitchen MRSA, PCR 12/02/2013 NEGATIVE  NEGATIVE Final  . Staphylococcus aureus 12/02/2013 NEGATIVE  NEGATIVE Final   Comment:        The Xpert SA Assay (FDA approved for NASAL specimens in patients over 47 years of age), is one component of a comprehensive surveillance program.  Test performance has been validated by [employer] for patients greater than or equal to 25 year old. It is not intended to diagnose infection nor to guide or monitor treatment.      X-Rays:No results found.  EKG:No orders found for this or any previous visit.   Hospital Course: Patient was admitted to [facility] and taken to the OR and underwent the above state procedure without complications.  Patient tolerated the procedure well and was  later transferred to the recovery room and then to the orthopaedic floor for postoperative care.  They were given PO and IV analgesics for pain control following their surgery.  They were given 24 hours of postoperative antibiotics of      Anti-infectives    Start     Dose/Rate Route Frequency Ordered Stop  12/10/13 2100  ceFAZolin (ANCEF) IVPB 2 g/50 mL premix     2 g100 mL/hr over 30 Minutes Intravenous Every 6 hours 12/10/13 1933 12/11/13 0333   12/10/13 0600  ceFAZolin (ANCEF) IVPB 2 g/50 mL premix     2 g100 mL/hr over 30 Minutes Intravenous On call to O.R. 12/09/13 1357 12/10/13 1543     and started on DVT prophylaxis in the form of Xarelto.   PT and OT were ordered for total hip protocol.  The patient was allowed to be WBAT with therapy. Discharge planning was consulted to help with postop disposition and equipment needs.  Patient had a good night on the evening of surgery.  They started to get up OOB with therapy on day one.  Hemovac drain was pulled without difficulty.  Patient was seen in rounds on day one and was felt to be able to go home later that afternoon if did well with therapy.  They were seen by therapy and was ready to go home.  Diet: Cardiac diet Activity:WBAT Follow-up:in 2 weeks Disposition - Home Discharged Condition: good      Medication List    TAKE these medications        bisoprolol 5 MG tablet  Commonly known as:  ZEBETA  Take 5 mg by mouth daily.     HYDROmorphone 2 MG tablet  Commonly known as:  DILAUDID  Take 1-2 tablets (2-4 mg total) by mouth every 4 (four) hours as needed for severe pain.     mesalamine 1.2 G EC tablet  Commonly known as:  LIALDA  Take 1.2 g by mouth daily with breakfast.     methocarbamol 500 MG tablet  Commonly known as:  ROBAXIN  Take 1 tablet (500 mg total) by mouth every 6 (six) hours as needed for muscle spasms.     ondansetron 4 MG tablet  Commonly known as:  ZOFRAN  Take 1 tablet (4 mg total) by mouth every 6  (six) hours as needed for nausea.     rivaroxaban 10 MG Tabs tablet  Commonly known as:  XARELTO  Take 1 tablet (10 mg total) by mouth daily with breakfast.     traMADol 50 MG tablet  Commonly known as:  ULTRAM  Take 1-2 tablets (50-100 mg total) by mouth every 6 (six) hours as needed for moderate pain.     zolpidem 10 MG tablet  Commonly known as:  AMBIEN  Take 5 mg by mouth at bedtime as needed for sleep.       Follow-up Information    Follow up with Gearlean Alf, MD. Schedule an appointment as soon as possible for a visit on 12/24/2013.   Specialty:  Orthopedic Surgery   Why:  Call [phone] tomorrow to make the appointment   Contact information:   [address] [place] [address] [phone]       Follow up with [facility].   Why:  HHPt   Contact information:   [address] [place] [address] [phone]       Signed: Arlee Muslim, PA-C Orthopaedic Surgery 01/14/2014, 9:06 AM

## 2016-06-19 IMAGING — CR DG PORTABLE PELVIS
1 series · 1 of 1 positions shown · non-contrast
Comparison: September 27, 2013.

CLINICAL DATA: Status post left total hip arthroplasty.

EXAM:
DG C-ARM 1-60 MIN - NRPT [facility]; PORTABLE PELVIS 1-2 VIEWS

[AP]
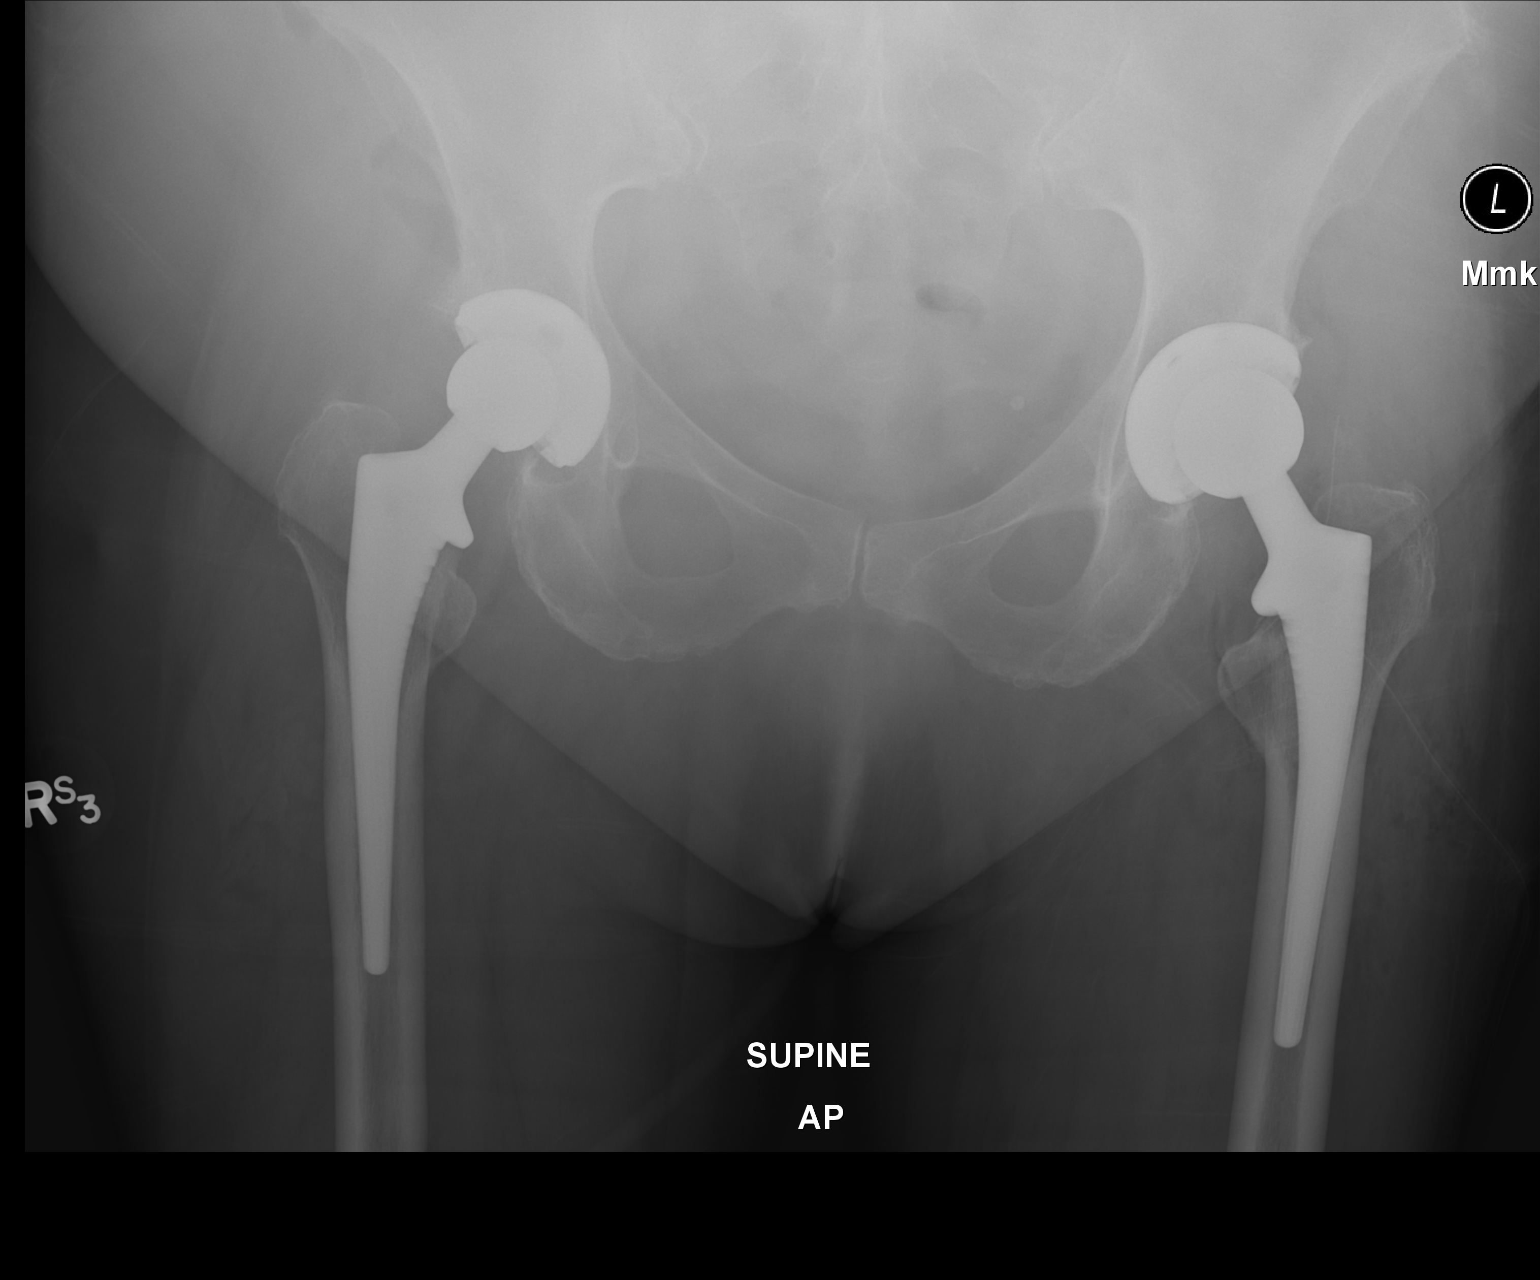

[1 of 1 positions shown; findings below may reference images not displayed]

FINDINGS: Status post previous right total hip arthroplasty. Now interval
placement of left total hip arthroplasty. The femoral and acetabular
components appear to be well situated. No fracture or dislocation is
noted.
IMPRESSION: Status post left total hip arthroplasty.

## 2017-01-30 ENCOUNTER — Other Ambulatory Visit: Payer: Self-pay

## 2017-01-30 ENCOUNTER — Encounter: Payer: Self-pay | Admitting: Physical Therapy

## 2017-01-30 ENCOUNTER — Ambulatory Visit: Payer: Medicare Other | Attending: Orthopedic Surgery | Admitting: Physical Therapy

## 2017-01-30 DIAGNOSIS — M545 Low back pain, unspecified: Secondary | ICD-10-CM

## 2017-01-30 DIAGNOSIS — M62830 Muscle spasm of back: Secondary | ICD-10-CM | POA: Diagnosis present

## 2017-01-30 NOTE — Therapy (Signed)
[place], [place], [address] Phone: [phone]   Fax:  [phone]  Physical Therapy Evaluation  Patient Details  Name: Sheila Ramsey MRN: [id number] Date of Birth: [birth year] Referring Provider: Wynelle Link   Encounter Date: 01/30/2017  PT End of Session - 01/30/17 0920    Visit Number  1    Date for PT Re-Evaluation  03/30/17    PT Start Time  0845    PT Stop Time  0930    PT Time Calculation (min)  45 min    Activity Tolerance  Patient tolerated treatment well    Behavior During Therapy  [facility] for tasks assessed/performed       Past Medical History:  Diagnosis Date  . Arthritis    "hips, knees" (12/10/2013)  . Cataract   . Diverticular disease   . Heart murmur   . High cholesterol   . Hypertension   . Kidney stones   . Macular degeneration   . OSA on CPAP   . PONV (postoperative nausea and vomiting)     Past Surgical History:  Procedure Laterality Date  . EXTRACORPOREAL SHOCK WAVE LITHOTRIPSY Left    had stent placed and then removed prior to lith  . EYE SURGERY    . JOINT REPLACEMENT    . KNEE ARTHROSCOPY Left 2014  . TONSILLECTOMY    . TOTAL HIP ARTHROPLASTY Right 09/27/2013   Procedure: RIGHT TOTAL HIP ARTHROPLASTY ANTERIOR APPROACH;  Surgeon: Gearlean Alf, MD;  Location: [place];  Service: Orthopedics;  Laterality: Right;  . TOTAL HIP ARTHROPLASTY Left 12/10/2013  . TOTAL HIP ARTHROPLASTY Left 12/10/2013   Procedure: LEFT TOTAL HIP ARTHROPLASTY ANTERIOR APPROACH;  Surgeon: Gearlean Alf, MD;  Location: [place];  Service: Orthopedics;  Laterality: Left;    There were no vitals filed for this visit.   Subjective Assessment - 01/30/17 0840    Subjective  Patient reports that she has had both hips replaced 2016.  Pain is mostly lower right SI area, she c/o "coccyx bone seems sore and too big".  Reports that she has had high stress levels over the past 6 months with her husband  passing away.  Reports no specific incident.    Limitations  Lifting;Sitting;House hold activities    Patient Stated Goals  have less pain    Currently in Pain?  Yes    Pain Score  1     Pain Location  Back    Pain Orientation  Right;Lower    Pain Descriptors / Indicators  Aching;Spasm;Tightness    Pain Type  Acute pain    Pain Radiating Towards  right coccyx and SI area out to the right hip    Pain Onset  More than a month ago    Pain Frequency  Constant    Aggravating Factors   sitting, vacuuming, mopping, lifting wood pain up yo 7/10    Pain Relieving Factors  lie down, pain will go down to a 1/10 after 30-60 minutes    Effect of Pain on Daily Activities  difficulty with ADL's         [facility] PT Assessment - 01/30/17 0001      Assessment   Medical Diagnosis  LBP    Referring Provider  Aluisio    Onset Date/Surgical Date  12/30/16    Prior Therapy  for hips 2-3 years ago      Precautions   Precautions  None  Balance Screen   Has the patient fallen in the past 6 months  No    Has the patient had a decrease in activity level because of a fear of falling?   No    Is the patient reluctant to leave their home because of a fear of falling?   No      Home Environment   Additional Comments  does her own housework, has does housework, does Land   Level of Independence  Independent    Vocation  Retired    Equities trader    Leisure  no exercise      Posture/Postural Control   Posture Comments  fwd head, rounded shoulders      ROM / Strength   AROM / PROM / Strength  AROM;Strength      AROM   Overall AROM Comments  Lumbar ROM WFL's as are the hips, she is very flexible in the hips except some tightness in the quads and hip flexors      Strength   Overall Strength Comments  4-/5 for the LE's, 3+/5 for the core      Flexibility   Soft Tissue Assessment /Muscle Length  -- very flexible LE's      Palpation   Palpation comment   tight and tender in the right lumbar area, tight in the right ITB, the piriformis and the gluteus medius, had stiffness of the lumbar spine with segmental mobility      Special Tests   Other special tests  right LE was 1/4" shorter, she does have a heel lift in this shoe      Ambulation/Gait   Gait Comments  no device, mild antalgic gait on the right with decreased toe off             Objective measurements completed on examination: See above findings.      [facility] Adult PT Treatment/Exercise - 01/30/17 0001      Modalities   Modalities  Electrical Stimulation;Moist Heat      Moist Heat Therapy   Number Minutes Moist Heat  15 Minutes    Moist Heat Location  Lumbar Spine      Electrical Stimulation   Electrical Stimulation Location  right SI area    Electrical Stimulation Action  IFC    Electrical Stimulation Parameters  supine    Electrical Stimulation Goals  Pain             PT Education - 01/30/17 0909    Education provided  Yes    Education Details  Kegels, TENs information    Person(s) Educated  Patient    Methods  Explanation;Demonstration;Handout    Comprehension  Verbalized understanding       PT Short Term Goals - 01/30/17 0924      PT SHORT TERM GOAL #1   Title  independent with HEP    Time  2    Period  Weeks    Status  New        PT Long Term Goals - 01/30/17 4098      PT LONG TERM GOAL #1   Title  safe with use of TENS    Time  8    Period  Weeks    Status  New      PT LONG TERM GOAL #2   Title  decrease pain 50%    Time  8    Period  Weeks  Status  New      PT LONG TERM GOAL #3   Title  start gym exercises safely    Time  8    Period  Weeks    Status  New      PT LONG TERM GOAL #4   Title  report minimal difficulty with using the vacuum    Time  8    Period  Weeks    Status  New             Plan - 01/30/17 0920    Clinical Impression Statement  Patient reports LBP, right SI pain over about 6 months, she  does report a lo of stress in her life with her husband passing away 5 months ago.  She has a history of bilateral hip replacements, she has right leg that looks to be about 1/4" shorter and she wears a lift in that shoe.  She is tight and tender in the gluteal and piriformis and the lumbar paraspinals.  She is very flexible in the HS and piriformis, mild tightness in the hip flexor, she was tight segmentally in the thoracic and lumbar spine, had weak core mms    Clinical Presentation  Stable    Clinical Decision Making  Low    Rehab Potential  Good    PT Frequency  2x / week    PT Duration  4 weeks    PT Treatment/Interventions  ADLs/Self Care Home Management;Electrical Stimulation;Moist Heat;Traction;Therapeutic exercise;Therapeutic activities;Functional mobility training;Patient/family education;Manual techniques    PT Next Visit Plan  slowly add core stabilization exercises, the ultimate goal would be for her to go to the gym on her own safely    Consulted and Agree with Plan of Care  Patient       Patient will benefit from skilled therapeutic intervention in order to improve the following deficits and impairments:  Abnormal gait, Decreased range of motion, Increased muscle spasms, Decreased activity tolerance, Pain, Improper body mechanics, Postural dysfunction, Decreased strength, Decreased mobility  Visit Diagnosis: Acute right-sided low back pain without sciatica - Plan: PT plan of care cert/re-cert  Muscle spasm of back - Plan: PT plan of care cert/re-cert     Problem List Patient Active Problem List   Diagnosis Date Noted  . OA (osteoarthritis) of hip 09/27/2013  . DIVERTICULOSIS-COLON 08/10/2009  . ANAL OR RECTAL PAIN 08/10/2009  . ABDOMINAL PAIN-RUQ 08/10/2009  . PERSONAL HX COLONIC POLYPS 08/10/2009    Sumner Boast., PT 01/30/2017, 9:27 AM  [facility] [address] [facility] [place], [place], [address] Phone:  [phone]   Fax:  [phone]  Name: Sheila Ramsey MRN: [id number] Date of Birth: [birth year]

## 2017-02-06 ENCOUNTER — Encounter: Payer: Self-pay | Admitting: Physical Therapy

## 2017-02-06 ENCOUNTER — Ambulatory Visit: Payer: Medicare Other | Attending: Orthopedic Surgery | Admitting: Physical Therapy

## 2017-02-06 DIAGNOSIS — M545 Low back pain, unspecified: Secondary | ICD-10-CM

## 2017-02-06 DIAGNOSIS — M62830 Muscle spasm of back: Secondary | ICD-10-CM | POA: Diagnosis present

## 2017-02-06 NOTE — Therapy (Signed)
[place], [place], [address] Phone: [phone]   Fax:  [phone]  Physical Therapy Treatment  Patient Details  Name: Sheila Ramsey MRN: [id number] Date of Birth: 1950-07-30 Referring Provider: Wynelle Link   Encounter Date: 02/06/2017  PT End of Session - 02/06/17 0955    Visit Number  2    Date for PT Re-Evaluation  03/30/17    PT Start Time  0923    PT Stop Time  1013    PT Time Calculation (min)  50 min    Activity Tolerance  Patient tolerated treatment well    Behavior During Therapy  [facility] for tasks assessed/performed       Past Medical History:  Diagnosis Date  . Arthritis    "hips, knees" (12/10/2013)  . Cataract   . Diverticular disease   . Heart murmur   . High cholesterol   . Hypertension   . Kidney stones   . Macular degeneration   . OSA on CPAP   . PONV (postoperative nausea and vomiting)     Past Surgical History:  Procedure Laterality Date  . EXTRACORPOREAL SHOCK WAVE LITHOTRIPSY Left    had stent placed and then removed prior to lith  . EYE SURGERY    . JOINT REPLACEMENT    . KNEE ARTHROSCOPY Left 2014  . TONSILLECTOMY    . TOTAL HIP ARTHROPLASTY Right 09/27/2013   Procedure: RIGHT TOTAL HIP ARTHROPLASTY ANTERIOR APPROACH;  Surgeon: Gearlean Alf, MD;  Location: [place];  Service: Orthopedics;  Laterality: Right;  . TOTAL HIP ARTHROPLASTY Left 12/10/2013  . TOTAL HIP ARTHROPLASTY Left 12/10/2013   Procedure: LEFT TOTAL HIP ARTHROPLASTY ANTERIOR APPROACH;  Surgeon: Gearlean Alf, MD;  Location: [place];  Service: Orthopedics;  Laterality: Left;    There were no vitals filed for this visit.  Subjective Assessment - 02/06/17 0948    Subjective  Patient reports that she felt very good after the last treatment, reports that she had the shingles vaccine and feels very bad, "like I have the flu"    Currently in Pain?  Yes    Pain Score  2     Pain Location  Back    Pain  Orientation  Right;Lower                      [facility] Adult PT Treatment/Exercise - 02/06/17 0001      Self-Care   Self-Care  Other Self-Care Comments    Other Self-Care Comments   Patient brough in two old TENS units, one of them would not work, the other one I could get to work on both channels but only one wire was good, so spoke with her about it and that probably a new one is her best option      Exercises   Exercises  Lumbar      Lumbar Exercises: Public affairs consultant  3 reps;10 seconds      Lumbar Exercises: Supine   Ab Set  15 reps;1 second    Other Supine Lumbar Exercises  feet on ball K2C, trunk rotation, small bridges and isometric abs      Moist Heat Therapy   Number Minutes Moist Heat  15 Minutes    Moist Heat Location  Lumbar Spine      Electrical Stimulation   Electrical Stimulation Location  right SI area    Electrical Stimulation Action  IFC  Electrical Stimulation Parameters  supine    Electrical Stimulation Goals  Pain               PT Short Term Goals - 01/30/17 0924      PT SHORT TERM GOAL #1   Title  independent with HEP    Time  2    Period  Weeks    Status  New        PT Long Term Goals - 01/30/17 6256      PT LONG TERM GOAL #1   Title  safe with use of TENS    Time  8    Period  Weeks    Status  New      PT LONG TERM GOAL #2   Title  decrease pain 50%    Time  8    Period  Weeks    Status  New      PT LONG TERM GOAL #3   Title  start gym exercises safely    Time  8    Period  Weeks    Status  New      PT LONG TERM GOAL #4   Title  report minimal difficulty with using the vacuum    Time  8    Period  Weeks    Status  New            Plan - 02/06/17 0957    Clinical Impression Statement  Patient overall reports that she feels very bad, but thinks it is due to a shingles vaccine, she reports that this is how she felt after the first one.  Recommended a new TENS    PT Next Visit Plan  slowly  add core stabilization exercises, the ultimate goal would be for her to go to the gym on her own safely    Consulted and Agree with Plan of Care  Patient       Patient will benefit from skilled therapeutic intervention in order to improve the following deficits and impairments:  Abnormal gait, Decreased range of motion, Increased muscle spasms, Decreased activity tolerance, Pain, Improper body mechanics, Postural dysfunction, Decreased strength, Decreased mobility  Visit Diagnosis: Acute right-sided low back pain without sciatica  Muscle spasm of back     Problem List Patient Active Problem List   Diagnosis Date Noted  . OA (osteoarthritis) of hip 09/27/2013  . DIVERTICULOSIS-COLON 08/10/2009  . ANAL OR RECTAL PAIN 08/10/2009  . ABDOMINAL PAIN-RUQ 08/10/2009  . PERSONAL HX COLONIC POLYPS 08/10/2009    Sumner Boast., PT 02/06/2017, 10:00 AM  [place] [address] [place], [place], [address] Phone: [phone]   Fax:  [phone]  Name: Sheila Ramsey MRN: [id number] Date of Birth: 1950/08/05

## 2017-02-08 ENCOUNTER — Encounter: Payer: Self-pay | Admitting: Physical Therapy

## 2017-02-08 ENCOUNTER — Ambulatory Visit: Payer: Medicare Other | Admitting: Physical Therapy

## 2017-02-08 DIAGNOSIS — M62830 Muscle spasm of back: Secondary | ICD-10-CM

## 2017-02-08 DIAGNOSIS — M545 Low back pain, unspecified: Secondary | ICD-10-CM

## 2017-02-08 NOTE — Therapy (Signed)
[place], [place], [address] Phone: [phone]   Fax:  [phone]  Physical Therapy Treatment  Patient Details  Name: VESPER TRANT MRN: [id number] Date of Birth: 05/30/1950 Referring Provider: Wynelle Link   Encounter Date: 02/08/2017  PT End of Session - 02/08/17 1008    Visit Number  3    Date for PT Re-Evaluation  03/30/17    PT Start Time  0923    PT Stop Time  1023    PT Time Calculation (min)  60 min    Activity Tolerance  Patient tolerated treatment well    Behavior During Therapy  [facility] for tasks assessed/performed       Past Medical History:  Diagnosis Date  . Arthritis    "hips, knees" (12/10/2013)  . Cataract   . Diverticular disease   . Heart murmur   . High cholesterol   . Hypertension   . Kidney stones   . Macular degeneration   . OSA on CPAP   . PONV (postoperative nausea and vomiting)     Past Surgical History:  Procedure Laterality Date  . EXTRACORPOREAL SHOCK WAVE LITHOTRIPSY Left    had stent placed and then removed prior to lith  . EYE SURGERY    . JOINT REPLACEMENT    . KNEE ARTHROSCOPY Left 2014  . TONSILLECTOMY    . TOTAL HIP ARTHROPLASTY Right 09/27/2013   Procedure: RIGHT TOTAL HIP ARTHROPLASTY ANTERIOR APPROACH;  Surgeon: Gearlean Alf, MD;  Location: [place];  Service: Orthopedics;  Laterality: Right;  . TOTAL HIP ARTHROPLASTY Left 12/10/2013  . TOTAL HIP ARTHROPLASTY Left 12/10/2013   Procedure: LEFT TOTAL HIP ARTHROPLASTY ANTERIOR APPROACH;  Surgeon: Gearlean Alf, MD;  Location: [place];  Service: Orthopedics;  Laterality: Left;    There were no vitals filed for this visit.  Subjective Assessment - 02/08/17 0936    Subjective  Reports that she is feeling much better.  Reports that she did some yardwork yesterday.    Currently in Pain?  Yes    Pain Score  2     Pain Location  Back    Pain Orientation  Right                      [facility]  Adult PT Treatment/Exercise - 02/08/17 0001      Lumbar Exercises: Stretches   Lower Trunk Rotation  3 reps;10 seconds    Quad Stretch  3 reps;10 seconds      Lumbar Exercises: Aerobic   Nustep  Level 3 x 8 minutes      Lumbar Exercises: Machines for Strengthening   Cybex Knee Extension  5# 2x10    Cybex Knee Flexion  20# 2x10    Other Lumbar Machine Exercise  10# AR press, 5# straight arm extension for core activation    Other Lumbar Machine Exercise  seated row 15# 2x10, lats 20# 2x10      Lumbar Exercises: Supine   Ab Set  15 reps;1 second    Other Supine Lumbar Exercises  feet on ball K2C, trunk rotation, small bridges and isometric abs      Lumbar Exercises: Sidelying   Clam  Both;20 reps;2 seconds    Hip Abduction  Right;20 reps;1 second      Moist Heat Therapy   Number Minutes Moist Heat  15 Minutes    Moist Heat Location  Lumbar Spine  Acupuncturist Location  right SI area    Chartered certified accountant  FC    Electrical Stimulation Parameters  supine    Electrical Stimulation Goals  Pain               PT Short Term Goals - 02/08/17 1011      PT SHORT TERM GOAL #1   Title  independent with HEP    Status  Achieved        PT Long Term Goals - 01/30/17 0925      PT LONG TERM GOAL #1   Title  safe with use of TENS    Time  8    Period  Weeks    Status  New      PT LONG TERM GOAL #2   Title  decrease pain 50%    Time  8    Period  Weeks    Status  New      PT LONG TERM GOAL #3   Title  start gym exercises safely    Time  8    Period  Weeks    Status  New      PT LONG TERM GOAL #4   Title  report minimal difficulty with using the vacuum    Time  8    Period  Weeks    Status  New            Plan - 02/08/17 1009    Clinical Impression Statement  Patient reports that she is suprised that she was not hurting much more after doing the yardwork yesterday.  She has weakness of the hips and core.   Needs some help with activation    PT Next Visit Plan  slowly add core stabilization exercises, the ultimate goal would be for her to go to the gym on her own safely    Consulted and Agree with Plan of Care  Patient       Patient will benefit from skilled therapeutic intervention in order to improve the following deficits and impairments:  Abnormal gait, Decreased range of motion, Increased muscle spasms, Decreased activity tolerance, Pain, Improper body mechanics, Postural dysfunction, Decreased strength, Decreased mobility  Visit Diagnosis: Acute right-sided low back pain without sciatica  Muscle spasm of back     Problem List Patient Active Problem List   Diagnosis Date Noted  . OA (osteoarthritis) of hip 09/27/2013  . DIVERTICULOSIS-COLON 08/10/2009  . ANAL OR RECTAL PAIN 08/10/2009  . ABDOMINAL PAIN-RUQ 08/10/2009  . PERSONAL HX COLONIC POLYPS 08/10/2009    Sumner Boast., PT 02/08/2017, 10:12 AM  [place] [address] [place], [place], [address] Phone: [phone]   Fax:  [phone]  Name: LURA FALOR MRN: [id number] Date of Birth: [birth year]

## 2017-02-13 ENCOUNTER — Encounter: Payer: Self-pay | Admitting: Physical Therapy

## 2017-02-13 ENCOUNTER — Ambulatory Visit: Payer: Medicare Other | Admitting: Physical Therapy

## 2017-02-13 DIAGNOSIS — M545 Low back pain, unspecified: Secondary | ICD-10-CM

## 2017-02-13 DIAGNOSIS — M62830 Muscle spasm of back: Secondary | ICD-10-CM

## 2017-02-13 NOTE — Therapy (Signed)
[place], [place], [address] Phone: [phone]   Fax:  [phone]  Physical Therapy Treatment  Patient Details  Name: Sheila Ramsey MRN: [id number] Date of Birth: [birth year] Referring Provider: Wynelle Link   Encounter Date: 02/13/2017  PT End of Session - 02/13/17 1223    Visit Number  4    Date for PT Re-Evaluation  03/30/17    PT Start Time  0539    PT Stop Time  1238    PT Time Calculation (min)  54 min    Activity Tolerance  Patient tolerated treatment well    Behavior During Therapy  [facility] for tasks assessed/performed       Past Medical History:  Diagnosis Date  . Arthritis    "hips, knees" (12/10/2013)  . Cataract   . Diverticular disease   . Heart murmur   . High cholesterol   . Hypertension   . Kidney stones   . Macular degeneration   . OSA on CPAP   . PONV (postoperative nausea and vomiting)     Past Surgical History:  Procedure Laterality Date  . EXTRACORPOREAL SHOCK WAVE LITHOTRIPSY Left    had stent placed and then removed prior to lith  . EYE SURGERY    . JOINT REPLACEMENT    . KNEE ARTHROSCOPY Left 2014  . TONSILLECTOMY    . TOTAL HIP ARTHROPLASTY Right 09/27/2013   Procedure: RIGHT TOTAL HIP ARTHROPLASTY ANTERIOR APPROACH;  Surgeon: Gearlean Alf, MD;  Location: [place];  Service: Orthopedics;  Laterality: Right;  . TOTAL HIP ARTHROPLASTY Left 12/10/2013  . TOTAL HIP ARTHROPLASTY Left 12/10/2013   Procedure: LEFT TOTAL HIP ARTHROPLASTY ANTERIOR APPROACH;  Surgeon: Gearlean Alf, MD;  Location: [place];  Service: Orthopedics;  Laterality: Left;    There were no vitals filed for this visit.  Subjective Assessment - 02/13/17 1148    Subjective  "I feel fine"    Currently in Pain?  No/denies    Pain Score  0-No pain                      [facility] Adult PT Treatment/Exercise - 02/13/17 0001      Lumbar Exercises: Stretches   Lower Trunk Rotation  3  reps;10 seconds    Quad Stretch  3 reps;10 seconds      Lumbar Exercises: Aerobic   Nustep  Level 3 x 8 minutes      Lumbar Exercises: Machines for Strengthening   Cybex Knee Extension  5# 2x10    Cybex Knee Flexion  20# 2x10    Other Lumbar Machine Exercise  10# AR press, 5# straight arm extension for core activation    Other Lumbar Machine Exercise  seated row 15# 2x10, lats 20# 2x10      Lumbar Exercises: Supine   Ab Set  15 reps;1 second    Bridge with [employer]  Compliant;15 reps;2 seconds    Bridge with clamshell  Compliant;15 reps;2 seconds    Other Supine Lumbar Exercises  feet on ball K2C, trunk rotation, small bridges and isometric abs      Moist Heat Therapy   Number Minutes Moist Heat  15 Minutes    Moist Heat Location  Lumbar Spine      Electrical Stimulation   Electrical Stimulation Location  right SI area    Electrical Stimulation Action  IFc    Electrical Stimulation Parameters  supine  Electrical Stimulation Goals  Pain               PT Short Term Goals - 02/08/17 1011      PT SHORT TERM GOAL #1   Title  independent with HEP    Status  Achieved        PT Long Term Goals - 02/13/17 1223      PT LONG TERM GOAL #2   Title  decrease pain 50%    Status  Achieved      PT LONG TERM GOAL #4   Title  report minimal difficulty with using the vacuum    Status  Partially Met            Plan - 02/13/17 1224    Clinical Impression Statement  Pt enters clinic reporting that she has no pain and had put in about 18 hours of yard work over the last week. Weakness in hip and core remain. Cues to activate core with standing extensions.    PT Frequency  2x / week    PT Duration  4 weeks    PT Next Visit Plan  slowly add core stabilization exercises, the ultimate goal would be for her to go to the gym on her own safely       Patient will benefit from skilled therapeutic intervention in order to improve the following deficits and impairments:   Abnormal gait, Decreased range of motion, Increased muscle spasms, Decreased activity tolerance, Pain, Improper body mechanics, Postural dysfunction, Decreased strength, Decreased mobility  Visit Diagnosis: Muscle spasm of back  Acute right-sided low back pain without sciatica     Problem List Patient Active Problem List   Diagnosis Date Noted  . OA (osteoarthritis) of hip 09/27/2013  . DIVERTICULOSIS-COLON 08/10/2009  . ANAL OR RECTAL PAIN 08/10/2009  . ABDOMINAL PAIN-RUQ 08/10/2009  . PERSONAL HX COLONIC POLYPS 08/10/2009    Scot Jun, PTA 02/13/2017, 12:25 PM  [place] [address] [place], [place], [address] Phone: [phone]   Fax:  [phone]  Name: TIFFNY GEMMER MRN: [id number] Date of Birth: 1950-02-04

## 2017-02-16 ENCOUNTER — Encounter: Payer: Self-pay | Admitting: Physical Therapy

## 2017-02-16 ENCOUNTER — Ambulatory Visit: Payer: Medicare Other | Admitting: Physical Therapy

## 2017-02-16 DIAGNOSIS — M545 Low back pain, unspecified: Secondary | ICD-10-CM

## 2017-02-16 DIAGNOSIS — M62830 Muscle spasm of back: Secondary | ICD-10-CM

## 2017-02-16 NOTE — Therapy (Signed)
[facility] [address] [place], [place], [address] Phone: [phone]   Fax:  [phone]  Physical Therapy Treatment  Patient Details  Name: Sheila Ramsey MRN: [id number] Date of Birth: 12/04/1950 Referring Provider: Aluisio   Encounter Date: 02/16/2017  PT End of Session - 02/16/17 1159    Visit Number  5    Date for PT Re-Evaluation  03/30/17    PT Start Time  1128    PT Stop Time  1214    PT Time Calculation (min)  46 min       Past Medical History:  Diagnosis Date  . Arthritis    "hips, knees" (12/10/2013)  . Cataract   . Diverticular disease   . Heart murmur   . High cholesterol   . Hypertension   . Kidney stones   . Macular degeneration   . OSA on CPAP   . PONV (postoperative nausea and vomiting)     Past Surgical History:  Procedure Laterality Date  . EXTRACORPOREAL SHOCK WAVE LITHOTRIPSY Left    had stent placed and then removed prior to lith  . EYE SURGERY    . JOINT REPLACEMENT    . KNEE ARTHROSCOPY Left 2014  . TONSILLECTOMY    . TOTAL HIP ARTHROPLASTY Right 09/27/2013   Procedure: RIGHT TOTAL HIP ARTHROPLASTY ANTERIOR APPROACH;  Surgeon: Frank Aluisio V, MD;  [facility];  Service: Orthopedics;  Laterality: Right;  . TOTAL HIP ARTHROPLASTY Left 12/10/2013  . TOTAL HIP ARTHROPLASTY Left 12/10/2013   Procedure: LEFT TOTAL HIP ARTHROPLASTY ANTERIOR APPROACH;  Surgeon: Frank Aluisio V, MD;  Location[facility];  Service: Orthopedics;  Laterality: Left;    There were no vitals filed for this visit.  Subjective Assessment - 02/16/17 1131    Subjective  "I am ok, my L knee is a little pulley" Pt repots [facility], Pt stated that's he did yoga and walking    Currently in Pain?  No/denies    Pain Score  0-No pain                      [facility] Adult PT Treatment/Exercise - 02/16/17 0001      Lumbar Exercises: Aerobic   Nustep  Level 3 x 8 minutes      Lumbar  Exercises: Machines for Strengthening   Cybex Knee Extension  5# 2x10    Cybex Knee Flexion  20# 2x10    Other Lumbar Machine Exercise  10# AR press, 5# straight arm extension for core activation    Other Lumbar Machine Exercise  seated row 20# 2x10, lats 20# 2x10      Lumbar Exercises: Supine   Ab Set  15 reps;1 second    Bridge with Ball Squeeze  Compliant;2 seconds;10 reps    Other Supine Lumbar Exercises  feet on ball K2C, trunk rotation, small bridges and isometric abs      Moist Heat Therapy   Number Minutes Moist Heat  15 Minutes    Moist Heat Location  Lumbar Spine      Electrical Stimulation   Electrical Stimulation Location  right SI area    Electrical Stimulation Action  IFC    Electrical Stimulation Parameters  supine    Electrical Stimulation Goals  Pain               PT Short Term Goals - 02/16/17 1200        PT SHORT TERM GOAL #1   Title  independent with HEP    Status  Achieved        PT Long Term Goals - 02/16/17 1200      PT LONG TERM GOAL #1   Title  safe with use of TENS    Status  Achieved      PT LONG TERM GOAL #2   Title  decrease pain 50%    Status  Achieved      PT LONG TERM GOAL #3   Title  start gym exercises safely    Status  Partially Met Pt reports that she will be hiring a Physiological scientist            Plan - 02/16/17 1159    Clinical Impression Statement  Most goals met. Pt report no functional limitations. She reports being pleased with her current functional status. Pt reports that's she will be hiring a Physiological scientist.    Rehab Potential  Good    PT Frequency  2x / week    PT Duration  4 weeks    PT Next Visit Plan  D/C PT        Patient will benefit from skilled therapeutic intervention in order to improve the following deficits and impairments:  Abnormal gait, Decreased range of motion, Increased muscle spasms, Decreased activity tolerance, Pain, Improper body mechanics, Postural dysfunction, Decreased strength,  Decreased mobility  Visit Diagnosis: Muscle spasm of back  Acute right-sided low back pain without sciatica     Problem List Patient Active Problem List   Diagnosis Date Noted  . OA (osteoarthritis) of hip 09/27/2013  . DIVERTICULOSIS-COLON 08/10/2009  . ANAL OR RECTAL PAIN 08/10/2009  . ABDOMINAL PAIN-RUQ 08/10/2009  . PERSONAL HX COLONIC POLYPS 08/10/2009    Scot Jun, PTA 02/16/2017, 12:02 PM  [place] [address] [place], [place], [address] Phone: [phone]   Fax:  [phone]  Name: TAIMA RADA MRN: [id number] Date of Birth: 09-20-1950

## 2018-05-04 ENCOUNTER — Encounter: Payer: Self-pay | Admitting: Family Medicine

## 2018-05-04 ENCOUNTER — Ambulatory Visit (INDEPENDENT_AMBULATORY_CARE_PROVIDER_SITE_OTHER): Payer: Medicare Other | Admitting: Family Medicine

## 2018-05-04 ENCOUNTER — Other Ambulatory Visit: Payer: Self-pay

## 2018-05-04 DIAGNOSIS — E785 Hyperlipidemia, unspecified: Secondary | ICD-10-CM

## 2018-05-04 DIAGNOSIS — I10 Essential (primary) hypertension: Secondary | ICD-10-CM | POA: Insufficient documentation

## 2018-05-04 NOTE — Progress Notes (Signed)
Virtual Visit via Video Note  I connected with Sheila Ramsey  on 05/04/18 at  1:00 PM EDT by a video enabled telemedicine application and verified that I am speaking with the correct person using two identifiers.  Location patient: home Location provider:work office Persons participating in the virtual visit: patient, provider  I discussed the limitations of evaluation and management by telemedicine and the availability of in person appointments. The patient expressed understanding and agreed to proceed.   Sheila Ramsey DOB: 1950/01/12 Encounter date: 05/04/2018  This is a 67 y.o. female who presents to establish care. Chief Complaint  Patient presents with  . Establish Care    History of present illness:  About a year ago a friend of hers had doctor that she really liked and doc announced retirement.  Lost husband 2 years ago.   No specific concerns today. Nothing that is bothering her from health standpoint.   Had colonoscopy in Jan.   Due for mammogram; is scheduling when available in July.   Had tetanus shot from primary at last visit within 6 months.   Arthritis: has had bilat total hips, left knee clean out. Joints in general are doing pretty well. Has had back issues since college. Tends to overwork and strain frequently. Doesn't take pain medications. Make her very ill. Even after hip replacements didn't take any pain medication or advil.   Sleep apnea: on CPAP:wears every night; sometimes takes off while she sleeps.  Macular degen:following with Dr. Sharen Counter for this and cataracts.   Kidney stones: Just one time deal; Saw urology. Does not follow with them now. Large kidney stone resolved with lithotripsy.   HTN: losartan 50mg  daily. When she checks at home gets 141/58 (this is high for her) usually more in 130's/58. No problems with medication.   HL: crestor 20mg  daily. NO problems with medication.   Follows with dentist regularly (Dr. Aaron Mose)  She is making it  through pandemic ok. Different being alone during this time. Has done well dealing with husband's passing. He passed from MI. She has support group/hospice group and stays busy socially. Has been doing a lot around house and in yard during this time.    Past Medical History:  Diagnosis Date  . Arthritis    "hips, knees" (12/10/2013)  . Cataract   . Diverticular disease   . Heart murmur   . High cholesterol   . Hypertension   . Kidney stones   . Macular degeneration   . OSA on CPAP   . PONV (postoperative nausea and vomiting)   . Sleep apnea    Past Surgical History:  Procedure Laterality Date  . EXTRACORPOREAL SHOCK WAVE LITHOTRIPSY Left    had stent placed and then removed prior to lith  . EYE SURGERY    . JOINT REPLACEMENT    . KNEE ARTHROSCOPY Left 2014  . TONSILLECTOMY    . TOTAL HIP ARTHROPLASTY Right 09/27/2013   Procedure: RIGHT TOTAL HIP ARTHROPLASTY ANTERIOR APPROACH;  Surgeon: Gearlean Alf, MD;  Location: [facility];  Service: Orthopedics;  Laterality: Right;  . TOTAL HIP ARTHROPLASTY Left 12/10/2013  . TOTAL HIP ARTHROPLASTY Left 12/10/2013   Procedure: LEFT TOTAL HIP ARTHROPLASTY ANTERIOR APPROACH;  Surgeon: Gearlean Alf, MD;  Location: [facility];  Service: Orthopedics;  Laterality: Left;   Allergies  Allergen Reactions  . Hydrocodone Nausea Only  . Oxycodone Nausea Only   Current Meds  Medication Sig  . losartan (COZAAR) 50 MG tablet Take 50  mg by mouth daily.  . rosuvastatin (CRESTOR) 20 MG tablet Take 20 mg by mouth daily.   Social History   Tobacco Use  . Smoking status: Former Smoker    Types: Cigarettes  . Smokeless tobacco: Never Used  . Tobacco comment: "socially smoked; quit smoking in the 1990's"  Substance Use Topics  . Alcohol use: Yes    Comment: 12/10/2013 "might have a couple drinks/month"   Family History  Problem Relation Age of Onset  . Dementia Mother      Review of Systems  Constitutional: Negative for chills, fatigue and fever.   Respiratory: Negative for cough, chest tightness, shortness of breath and wheezing.   Cardiovascular: Negative for chest pain, palpitations and leg swelling.  Psychiatric/Behavioral: Negative for sleep disturbance. The patient is not nervous/anxious.     Objective:  There were no vitals taken for this visit.      BP Readings from Last 3 Encounters:  12/11/13 (!) 112/51  12/02/13 (!) 155/62  09/28/13 (!) 141/54   Wt Readings from Last 3 Encounters:  12/10/13 197 lb (89.4 kg)  12/02/13 197 lb 7 oz (89.6 kg)  09/27/13 195 lb (88.5 kg)    EXAM:  GENERAL: alert, oriented, appears well and in no acute distress  HEENT: atraumatic, conjunctiva clear, no obvious abnormalities on inspection of external nose and ears  NECK: normal movements of the head and neck  LUNGS: on inspection no signs of respiratory distress, breathing rate appears normal, no obvious gross SOB, gasping or wheezing  CV: no obvious cyanosis  MS: moves all visible extremities without noticeable abnormality  PSYCH/NEURO: pleasant and cooperative, no obvious depression or anxiety, speech and thought processing grossly intact  SKIN: no noted abnormalities of face/hands  Assessment/Plan  1. Essential hypertension Continue current medication; stable. Has had recent bloodwork (in last 3 months) from previous provider.   2. Hyperlipidemia, unspecified hyperlipidemia type Continue current medication.   Return in about 6 months (around 11/04/2018) for Chronic condition visit.    I discussed the assessment and treatment plan with the patient. The patient was provided an opportunity to ask questions and all were answered. The patient agreed with the plan and demonstrated an understanding of the instructions.   The patient was advised to call back or seek an in-person evaluation if the symptoms worsen or if the condition fails to improve as anticipated.  I provided 30 minutes of non-face-to-face time during this  encounter.   Micheline Rough, MD

## 2018-08-02 ENCOUNTER — Other Ambulatory Visit: Payer: Self-pay | Admitting: Family Medicine

## 2018-08-06 LAB — HM MAMMOGRAPHY

## 2018-08-16 ENCOUNTER — Encounter: Payer: Self-pay | Admitting: Family Medicine

## 2018-10-30 ENCOUNTER — Ambulatory Visit: Payer: Self-pay

## 2018-10-30 ENCOUNTER — Emergency Department (HOSPITAL_BASED_OUTPATIENT_CLINIC_OR_DEPARTMENT_OTHER)
Admission: EM | Admit: 2018-10-30 | Discharge: 2018-10-30 | Disposition: A | Discharge status: Home / Self Care | Payer: Medicare Other | Attending: Emergency Medicine | Admitting: Emergency Medicine

## 2018-10-30 ENCOUNTER — Other Ambulatory Visit: Payer: Self-pay

## 2018-10-30 ENCOUNTER — Emergency Department (HOSPITAL_BASED_OUTPATIENT_CLINIC_OR_DEPARTMENT_OTHER): Payer: Medicare Other

## 2018-10-30 ENCOUNTER — Telehealth (INDEPENDENT_AMBULATORY_CARE_PROVIDER_SITE_OTHER): Payer: Medicare Other | Admitting: Family Medicine

## 2018-10-30 ENCOUNTER — Encounter (HOSPITAL_BASED_OUTPATIENT_CLINIC_OR_DEPARTMENT_OTHER): Payer: Self-pay

## 2018-10-30 ENCOUNTER — Encounter: Payer: Self-pay | Admitting: Family Medicine

## 2018-10-30 VITALS — BP 213/99 | HR 91 | Temp 99.1°F

## 2018-10-30 DIAGNOSIS — R5383 Other fatigue: Secondary | ICD-10-CM | POA: Diagnosis not present

## 2018-10-30 DIAGNOSIS — Z20828 Contact with and (suspected) exposure to other viral communicable diseases: Secondary | ICD-10-CM | POA: Insufficient documentation

## 2018-10-30 DIAGNOSIS — Z885 Allergy status to narcotic agent status: Secondary | ICD-10-CM | POA: Diagnosis not present

## 2018-10-30 DIAGNOSIS — R030 Elevated blood-pressure reading, without diagnosis of hypertension: Secondary | ICD-10-CM | POA: Diagnosis not present

## 2018-10-30 DIAGNOSIS — Z79899 Other long term (current) drug therapy: Secondary | ICD-10-CM | POA: Diagnosis not present

## 2018-10-30 DIAGNOSIS — Z87891 Personal history of nicotine dependence: Secondary | ICD-10-CM | POA: Diagnosis not present

## 2018-10-30 DIAGNOSIS — R5381 Other malaise: Secondary | ICD-10-CM

## 2018-10-30 DIAGNOSIS — I10 Essential (primary) hypertension: Secondary | ICD-10-CM | POA: Diagnosis not present

## 2018-10-30 DIAGNOSIS — E782 Mixed hyperlipidemia: Secondary | ICD-10-CM | POA: Diagnosis not present

## 2018-10-30 DIAGNOSIS — R52 Pain, unspecified: Secondary | ICD-10-CM | POA: Diagnosis not present

## 2018-10-30 LAB — CBC WITH DIFFERENTIAL/PLATELET
Abs Immature Granulocytes: 0.03 10*3/uL (ref 0.00–0.07)
Basophils Absolute: 0.1 10*3/uL (ref 0.0–0.1)
Basophils Relative: 1 %
Eosinophils Absolute: 0.1 10*3/uL (ref 0.0–0.5)
Eosinophils Relative: 1 %
HCT: 47.7 % — ABNORMAL HIGH (ref 36.0–46.0)
Hemoglobin: 16.4 g/dL — ABNORMAL HIGH (ref 12.0–15.0)
Immature Granulocytes: 1 %
Lymphocytes Relative: 19 %
Lymphs Abs: 0.9 10*3/uL (ref 0.7–4.0)
MCH: 32 pg (ref 26.0–34.0)
MCHC: 34.4 g/dL (ref 30.0–36.0)
MCV: 93.2 fL (ref 80.0–100.0)
Monocytes Absolute: 0.4 10*3/uL (ref 0.1–1.0)
Monocytes Relative: 9 %
Neutro Abs: 3.1 10*3/uL (ref 1.7–7.7)
Neutrophils Relative %: 69 %
Platelets: 293 10*3/uL (ref 150–400)
RBC: 5.12 MIL/uL — ABNORMAL HIGH (ref 3.87–5.11)
RDW: 11.8 % (ref 11.5–15.5)
WBC: 4.5 10*3/uL (ref 4.0–10.5)
nRBC: 0 % (ref 0.0–0.2)

## 2018-10-30 LAB — URINALYSIS, ROUTINE W REFLEX MICROSCOPIC
Bilirubin Urine: NEGATIVE
Glucose, UA: NEGATIVE mg/dL
Hgb urine dipstick: NEGATIVE
Ketones, ur: NEGATIVE mg/dL
Leukocytes,Ua: NEGATIVE
Nitrite: NEGATIVE
Protein, ur: NEGATIVE mg/dL
Specific Gravity, Urine: <1.005 — ABNORMAL LOW (ref 1.005–1.030)
pH: 5.5 (ref 5.0–8.0)

## 2018-10-30 LAB — TROPONIN I (HIGH SENSITIVITY)
Troponin I (High Sensitivity): 9 ng/L (ref <18)
Troponin I (High Sensitivity): 14 ng/L (ref <18)

## 2018-10-30 LAB — COMPREHENSIVE METABOLIC PANEL
ALT: 22 U/L (ref 0–44)
AST: 23 U/L (ref 15–41)
Albumin: 4.9 g/dL (ref 3.5–5.0)
Alkaline Phosphatase: 55 U/L (ref 38–126)
Anion gap: 10 (ref 5–15)
BUN: 12 mg/dL (ref 8–23)
CO2: 26 mmol/L (ref 22–32)
Calcium: 9.9 mg/dL (ref 8.9–10.3)
Chloride: 103 mmol/L (ref 98–111)
Creatinine, Ser: 0.85 mg/dL (ref 0.44–1.00)
GFR calc Af Amer: >60 mL/min (ref >60)
GFR calc non Af Amer: >60 mL/min (ref >60)
Glucose, Bld: 138 mg/dL — ABNORMAL HIGH (ref 70–99)
Potassium: 3.8 mmol/L (ref 3.5–5.1)
Sodium: 139 mmol/L (ref 135–145)
Total Bilirubin: 1 mg/dL (ref 0.3–1.2)
Total Protein: 7.5 g/dL (ref 6.5–8.1)

## 2018-10-30 LAB — BRAIN NATRIURETIC PEPTIDE: B Natriuretic Peptide: 22.7 pg/mL (ref 0.0–100.0)

## 2018-10-30 MED ORDER — SODIUM CHLORIDE 0.9 % IV SOLN: INTRAVENOUS | Status: DC

## 2018-10-30 MED ORDER — AMLODIPINE BESYLATE 5 MG PO TABS: 5.0000 mg | ORAL_TABLET | Freq: Every day | ORAL | 0 refills | Status: DC

## 2018-10-30 NOTE — ED Triage Notes (Addendum)
Pt c/o "high blood pressure" since last Thursday-today at home "233/94"-denies pain-virtual PCP visit and sent to ED-NAD-steady gait

## 2018-10-30 NOTE — ED Provider Notes (Addendum)
[facility] EMERGENCY DEPARTMENT Provider Note   CSN: [id number] Arrival date & time: 10/30/18  1312     History   Chief Complaint Chief Complaint  Patient presents with  . Hypertension    HPI Sheila Ramsey is a 68 y.o. female.     Patient with known history of high blood pressure.  Has been on Cozaar for a long time no changes.  Leave recently placed the blood pressure has been elevated Thursday at home she got 233/94.  She had a virtual visit with her primary care doctor.  And was sent to the emergency department for evaluation of elevated blood pressure as well as feeling fatigued and malaise.  And there was some concern about Covid.  But no upper respiratory symptoms.  No chest pain the patient has not felt like this in a while.  Patient has had some recent stress in her life.  Past medical history significant for hypertension high cholesterol.  Patient denies shortness of breath denies chest pain denies dysuria nausea vomiting abdominal pain no change in taste or smell no headache no fevers.     Past Medical History:  Diagnosis Date  . Arthritis    "hips, knees" (12/10/2013)  . Cataract   . Diverticular disease   . Heart murmur   . High cholesterol   . Hypertension   . Kidney stones   . Macular degeneration   . OSA on CPAP   . PONV (postoperative nausea and vomiting)   . Sleep apnea     Patient Active Problem List   Diagnosis Date Noted  . Hypertension 05/04/2018  . Hyperlipidemia 05/04/2018  . OA (osteoarthritis) of hip 09/27/2013  . DIVERTICULOSIS-COLON 08/10/2009  . PERSONAL HX COLONIC POLYPS 08/10/2009    Past Surgical History:  Procedure Laterality Date  . EXTRACORPOREAL SHOCK WAVE LITHOTRIPSY Left    had stent placed and then removed prior to lith  . EYE SURGERY    . JOINT REPLACEMENT    . KNEE ARTHROSCOPY Left 2014  . TONSILLECTOMY    . TOTAL HIP ARTHROPLASTY Right 09/27/2013   Procedure: RIGHT TOTAL HIP ARTHROPLASTY ANTERIOR APPROACH;   Surgeon: Gearlean Alf, MD;  Location: [facility];  Service: Orthopedics;  Laterality: Right;  . TOTAL HIP ARTHROPLASTY Left 12/10/2013  . TOTAL HIP ARTHROPLASTY Left 12/10/2013   Procedure: LEFT TOTAL HIP ARTHROPLASTY ANTERIOR APPROACH;  Surgeon: Gearlean Alf, MD;  Location: [facility];  Service: Orthopedics;  Laterality: Left;     OB History   No obstetric history on file.      Home Medications    Prior to Admission medications   Medication Sig Start Date End Date Taking? Authorizing Provider  losartan (COZAAR) 50 MG tablet TAKE 1 TABLET BY MOUTH EVERY DAY 08/02/18   Koberlein, Junell C, MD  rosuvastatin (CRESTOR) 20 MG tablet TAKE 1 TABLET BY MOUTH EVERY DAY 08/02/18   Caren Macadam, MD    Family History Family History  Problem Relation Age of Onset  . Dementia Mother     Social History Social History   Tobacco Use  . Smoking status: Former Smoker    Types: Cigarettes  . Smokeless tobacco: Never Used  . Tobacco comment: "socially smoked; quit smoking in the 1990's"  Substance Use Topics  . Alcohol use: Yes    Comment: occ  . Drug use: No     Allergies   Hydrocodone and Oxycodone   Review of Systems Review of Systems  Constitutional: Positive for  fatigue. Negative for chills and fever.  HENT: Negative for congestion, rhinorrhea and sore throat.   Eyes: Negative for visual disturbance.  Respiratory: Negative for cough and shortness of breath.   Cardiovascular: Negative for chest pain and leg swelling.  Gastrointestinal: Negative for abdominal pain, diarrhea, nausea and vomiting.  Genitourinary: Negative for dysuria.  Musculoskeletal: Negative for back pain and neck pain.  Skin: Negative for rash.  Neurological: Negative for dizziness, syncope, speech difficulty, weakness, light-headedness, numbness and headaches.  Hematological: Does not bruise/bleed easily.  Psychiatric/Behavioral: Negative for confusion.     Physical Exam Updated Vital Signs BP (!)  160/93   Pulse 92   Temp 98.1 F (36.7 C) (Oral)   Resp 20   Ht 1.6 m (5\' 3" )   Wt 80.7 kg   SpO2 99%   BMI 31.53 kg/m   Physical Exam Vitals signs and nursing note reviewed.  Constitutional:      General: She is not in acute distress.    Appearance: Normal appearance. She is well-developed.  HENT:     Head: Normocephalic and atraumatic.  Eyes:     Extraocular Movements: Extraocular movements intact.     Conjunctiva/sclera: Conjunctivae normal.     Pupils: Pupils are equal, round, and reactive to light.  Neck:     Musculoskeletal: Normal range of motion and neck supple.  Cardiovascular:     Rate and Rhythm: Normal rate and regular rhythm.     Heart sounds: No murmur.  Pulmonary:     Effort: Pulmonary effort is normal. No respiratory distress.     Breath sounds: Normal breath sounds.  Abdominal:     General: Abdomen is flat.     Palpations: Abdomen is soft.     Tenderness: There is no abdominal tenderness.  Musculoskeletal: Normal range of motion.        General: No swelling or tenderness.  Skin:    General: Skin is warm and dry.     Capillary Refill: Capillary refill takes less than 2 seconds.  Neurological:     General: No focal deficit present.     Mental Status: She is alert and oriented to person, place, and time.     Cranial Nerves: No cranial nerve deficit.     Sensory: No sensory deficit.     Motor: No weakness.      ED Treatments / Results  Labs (all labs ordered are listed, but only abnormal results are displayed) Labs Reviewed  COMPREHENSIVE METABOLIC PANEL - Abnormal; Notable for the following components:      Result Value   Glucose, Bld 138 (*)    All other components within normal limits  CBC WITH DIFFERENTIAL/PLATELET - Abnormal; Notable for the following components:   RBC 5.12 (*)    Hemoglobin 16.4 (*)    HCT 47.7 (*)    All other components within normal limits  URINALYSIS, ROUTINE W REFLEX MICROSCOPIC - Abnormal; Notable for the  following components:   Color, Urine STRAW (*)    Specific Gravity, Urine <1.005 (*)    All other components within normal limits  BRAIN NATRIURETIC PEPTIDE  TROPONIN I (HIGH SENSITIVITY)    EKG EKG Interpretation  Date/Time:  Tuesday October 30 2018 14:54:39 EDT Ventricular Rate:  77 PR Interval:    QRS Duration: 104 QT Interval:  479 QTC Calculation: 543 R Axis:   61 Text Interpretation: Sinus arrhythmia Ventricular premature complex Probable left atrial enlargement Borderline repolarization abnormality Prolonged QT interval No previous ECGs available Confirmed  by Fredia Sorrow [phone]) on 10/30/2018 3:05:49 PM   Radiology Dg Chest Port 1 View  Result Date: 10/30/2018 CLINICAL DATA:  Flu-like symptoms EXAM: PORTABLE CHEST 1 VIEW COMPARISON:  None. FINDINGS: The heart size and mediastinal contours are within normal limits. Both lungs are clear. The visualized skeletal structures are unremarkable. IMPRESSION: No acute cardiopulmonary process. Electronically Signed   By: Prudencio Pair M.D.   On: 10/30/2018 14:00    Procedures Procedures (including critical care time)  Medications Ordered in ED Medications  0.9 %  sodium chloride infusion (has no administration in time range)     Initial Impression / Assessment and Plan / ED Course  I have reviewed the triage vital signs and the nursing notes.  Pertinent labs & imaging results that were available during my care of the patient were reviewed by me and considered in my medical decision making (see chart for details).       After discussion with patient's primary sounds as if they wanted a general screen is as well as Covid testing being done.  Patient without any hard-core symptoms suggestive of COVID-19.  But certainly reasonable to do a screening.  Blood pressure was significantly elevated improved here a fair amount without any intervention currently 160/93.  Feel that blood pressure is not a hypertensive urgency or  emergency and patient can probably follow back up with primary care doctor for that.  Depending on lab results.  Patient's BNP and high-sensitivity troponin are still pending.  Otherwise labs fairly normal.  Chest x-ray was negative for any pneumonia.  Patient's oxygen saturations 99%.  No fever here and respiratory rate is 14.  Disposition will be based on the troponin and the BMP.  If those are not significantly abnormal then patient will get outpatient Covid testing follow back up with primary care doctor for the hypertension.   Final Clinical Impressions(s) / ED Diagnoses   Final diagnoses:  Essential hypertension  Malaise and fatigue    ED Discharge Orders    None       Fredia Sorrow, MD 10/30/18 1526   Patient's BMP without significant elevation.  However troponin was 9.  So will require delta troponin.  Will have evening physician checked the delta troponin.  624-hour Covid test ordered.  Disposition will be based on the second troponin.   Fredia Sorrow, MD 10/30/18 1530

## 2018-10-30 NOTE — Progress Notes (Addendum)
Virtual Visit via Video Note  I connected with Sheila Ramsey  on 10/30/18 at 12:00 PM EDT by a video enabled telemedicine application and verified that I am speaking with the correct person using two identifiers.  Location patient: home Location provider:work or home office Persons participating in the virtual visit: patient, provider  I discussed the limitations of evaluation and management by telemedicine and the availability of in person appointments. The patient expressed understanding and agreed to proceed.   HPI:  Acute visit Hypertension: -she has noticed higher blood pressure the last 1 week w/ very high BP today and does not feel well -reports normally runs around 138/70s, but reports usually runs higher in 140s SBP at doctor visits -this morning BP in 190-230s/ 90s-100s -she takes losartan 50mg  daily for hypertension  -denies CP, SOB, HA, vision changes, swelling, palpitations -she feels like she might have a mild flu and reports " I feel the way I felt when I waas septic in the past", tired, achy - no congestin or cough, no diarrhea or vomiting or dysuria -she is anxious about a stroke as brother in law died of a stroke -taking grapeseed extract but no other medications otc other then listed medications -she has 2 bp cuffs at home and both are reading quite high   ROS: See pertinent positives and negatives per HPI.  Past Medical History:  Diagnosis Date  . Arthritis    "hips, knees" (12/10/2013)  . Cataract   . Diverticular disease   . Heart murmur   . High cholesterol   . Hypertension   . Kidney stones   . Macular degeneration   . OSA on CPAP   . PONV (postoperative nausea and vomiting)   . Sleep apnea     Past Surgical History:  Procedure Laterality Date  . EXTRACORPOREAL SHOCK WAVE LITHOTRIPSY Left    had stent placed and then removed prior to lith  . EYE SURGERY    . JOINT REPLACEMENT    . KNEE ARTHROSCOPY Left 2014  . TONSILLECTOMY    . TOTAL HIP  ARTHROPLASTY Right 09/27/2013   Procedure: RIGHT TOTAL HIP ARTHROPLASTY ANTERIOR APPROACH;  Surgeon: Gearlean Alf, MD;  Location: [facility];  Service: Orthopedics;  Laterality: Right;  . TOTAL HIP ARTHROPLASTY Left 12/10/2013  . TOTAL HIP ARTHROPLASTY Left 12/10/2013   Procedure: LEFT TOTAL HIP ARTHROPLASTY ANTERIOR APPROACH;  Surgeon: Gearlean Alf, MD;  Location: [facility];  Service: Orthopedics;  Laterality: Left;    Family History  Problem Relation Age of Onset  . Dementia Mother     SOCIAL HX: see hpi   Current Outpatient Medications:  .  losartan (COZAAR) 50 MG tablet, TAKE 1 TABLET BY MOUTH EVERY DAY, Disp: 90 tablet, Rfl: 0 .  rosuvastatin (CRESTOR) 20 MG tablet, TAKE 1 TABLET BY MOUTH EVERY DAY, Disp: 90 tablet, Rfl: 0  EXAM:  VITALS per patient if applicable:  Today's Vitals   10/30/18 1154  BP: (!) 213/99  Pulse: 91  Temp: 99.1 F (37.3 C)   There is no height or weight on file to calculate BMI.   GENERAL: alert, oriented, appears well and in no acute distress  HEENT: atraumatic, conjunttiva clear, no obvious abnormalities on inspection of external nose and ears  NECK: normal movements of the head and neck  LUNGS: on inspection no signs of respiratory distress, breathing rate appears normal, no obvious gross SOB, gasping or wheezing  CV: no obvious cyanosis  MS: moves all visible extremities without noticeable  abnormality  PSYCH/NEURO: pleasant and cooperative, no obvious depression or anxiety, speech and thought processing grossly intact  ASSESSMENT AND PLAN:  Discussed the following assessment and plan:  Elevated blood pressure reading  Body aches  Malaise  -we discussed possible serious and likely etiologies, options for evaluation and workup, limitations of telemedicine visit vs in person visit, treatment, treatment risks and precautions. Pt prefers to treat via telemedicine empirically rather then risking or undertaking an in person visit at this  moment. However, given her very high BP and symptoms I advised she needs to seek inperson evaluation right away. After a lengthy discussion she finally agreed to seek inperson care at the [facility] med center. She refuses EMS transport which I offered to arrange and reports she will have someone drive her. Agrees to go right away. Advised assistant to notify [facility] staff. Discussed the assessment and treatment plan with the patient. The patient was provided an opportunity to ask questions and all were answered. The patient agreed with the plan and demonstrated an understanding of the instructions.   The patient was advised to call back or seek an in-person evaluation if the symptoms worsen or if the condition fails to improve as anticipated.   Sheila Kern, DO

## 2018-10-30 NOTE — Telephone Encounter (Signed)
Appt today at 12pm with Dr Maudie Mercury.

## 2018-10-30 NOTE — Telephone Encounter (Signed)
Patient called stating that her BP is high.  She states she noticed her BP high about 1 week ago with the death of a family member.  Today BP 190/78 and when speaking with me 233/95.  She states she has no symptom but general tired and flu like.  She states she has no flu symptoms but has give temp of 98-99 when she checks.  She has felt this way in the past with HTN and had kidney stone and became septic. She denies flank pain states some mild abdominal twinge every now and then. She has a call in to her specialist. She denies chest pain and neurological symptoms.  She has not missed her medication for hypertension. Care advice read to patient.  She verbalized understanding. Call transferred to office for scheduling  Reason for Disposition . [id number] Systolic BP  >= [id number] OR Diastolic >= [id number]  AND [id number] having NO cardiac or neurologic symptoms  Answer Assessment - Initial Assessment Questions 1. BLOOD PRESSURE: "What is the blood pressure?" "Did you take at least two measurements 5 minutes apart?"     190/78,  233/95 2. ONSET: "When did you take your blood pressure?"     1 week ago with death in family 3. HOW: "How did you obtain the blood pressure?" (e.g., visiting nurse, automatic home BP monitor)    Home monitor 4. HISTORY: "Do you have a history of high blood pressure?"    yes 5. MEDICATIONS: "Are you taking any medications for blood pressure?" "Have you missed any doses recently?"     none 6. OTHER SYMPTOMS: "Do you have any symptoms?" (e.g., headache, chest pain, blurred vision, difficulty breathing, weakness)     Fatigue, stressed, had death in family 7. PREGNANCY: "Is there any chance you are pregnant?" "When was your last menstrual period?"    no  Protocols used: HIGH BLOOD PRESSURE-A-AH

## 2018-10-30 NOTE — ED Provider Notes (Signed)
Received care of pt from Dr. Rogene Houston.  See his note for prior h/p and care. Briefly, presented with fatigue and htn. Delta troponin pending.  Delta troponin with delta of 5. Pt without chest pain, hx not consistent with ACS. BP have been fluctuating in ED.  Will initiate amlodipine, recommend close PCP follow up.    Gareth Morgan, MD 11/01/18 2130

## 2018-11-01 LAB — NOVEL CORONAVIRUS, NAA (HOSP ORDER, SEND-OUT TO REF LAB; TAT 18-24 HRS): SARS-CoV-2, NAA: NOT DETECTED

## 2018-11-03 ENCOUNTER — Other Ambulatory Visit: Payer: Self-pay | Admitting: Family Medicine

## 2018-11-19 ENCOUNTER — Other Ambulatory Visit: Payer: Self-pay | Admitting: Family Medicine

## 2018-11-19 ENCOUNTER — Telehealth: Payer: Self-pay | Admitting: Family Medicine

## 2018-11-19 ENCOUNTER — Other Ambulatory Visit: Payer: Self-pay

## 2018-11-19 ENCOUNTER — Other Ambulatory Visit (INDEPENDENT_AMBULATORY_CARE_PROVIDER_SITE_OTHER): Payer: Medicare Other

## 2018-11-19 DIAGNOSIS — E785 Hyperlipidemia, unspecified: Secondary | ICD-10-CM

## 2018-11-19 DIAGNOSIS — I10 Essential (primary) hypertension: Secondary | ICD-10-CM | POA: Diagnosis not present

## 2018-11-19 LAB — COMPREHENSIVE METABOLIC PANEL
ALT: 18 U/L (ref 0–35)
AST: 17 U/L (ref 0–37)
Albumin: 4.7 g/dL (ref 3.5–5.2)
Alkaline Phosphatase: 54 U/L (ref 39–117)
BUN: 14 mg/dL (ref 6–23)
CO2: 29 mEq/L (ref 19–32)
Calcium: 9.9 mg/dL (ref 8.4–10.5)
Chloride: 103 mEq/L (ref 96–112)
Creatinine, Ser: 0.8 mg/dL (ref 0.40–1.20)
GFR: 71.54 mL/min (ref >60.00)
Glucose, Bld: 164 mg/dL — ABNORMAL HIGH (ref 70–99)
Potassium: 4.3 mEq/L (ref 3.5–5.1)
Sodium: 140 mEq/L (ref 135–145)
Total Bilirubin: 0.9 mg/dL (ref 0.2–1.2)
Total Protein: 7.2 g/dL (ref 6.0–8.3)

## 2018-11-19 LAB — CBC WITH DIFFERENTIAL/PLATELET
Basophils Absolute: 0.1 10*3/uL (ref 0.0–0.1)
Basophils Relative: 1.6 % (ref 0.0–3.0)
Eosinophils Absolute: 0.1 10*3/uL (ref 0.0–0.7)
Eosinophils Relative: 1.8 % (ref 0.0–5.0)
HCT: 45.5 % (ref 36.0–46.0)
Hemoglobin: 15.7 g/dL — ABNORMAL HIGH (ref 12.0–15.0)
Lymphocytes Relative: 30.6 % (ref 12.0–46.0)
Lymphs Abs: 1.2 10*3/uL (ref 0.7–4.0)
MCHC: 34.4 g/dL (ref 30.0–36.0)
MCV: 94.1 fl (ref 78.0–100.0)
Monocytes Absolute: 0.3 10*3/uL (ref 0.1–1.0)
Monocytes Relative: 8.7 % (ref 3.0–12.0)
Neutro Abs: 2.2 10*3/uL (ref 1.4–7.7)
Neutrophils Relative %: 57.3 % (ref 43.0–77.0)
Platelets: 288 10*3/uL (ref 150.0–400.0)
RBC: 4.84 Mil/uL (ref 3.87–5.11)
RDW: 12.6 % (ref 11.5–15.5)
WBC: 3.8 10*3/uL — ABNORMAL LOW (ref 4.0–10.5)

## 2018-11-19 LAB — LIPID PANEL
Cholesterol: 165 mg/dL (ref 0–200)
HDL: 56.7 mg/dL (ref >39.00)
LDL Cholesterol: 85 mg/dL (ref 0–99)
NonHDL: 108.78
Total CHOL/HDL Ratio: 3
Triglycerides: 117 mg/dL (ref 0.0–149.0)
VLDL: 23.4 mg/dL (ref 0.0–40.0)

## 2018-11-19 LAB — TSH: TSH: 1.9 u[IU]/mL (ref 0.35–4.50)

## 2018-11-19 MED ORDER — AMLODIPINE BESYLATE 5 MG PO TABS: 5.0000 mg | ORAL_TABLET | Freq: Every day | ORAL | 1 refills | Status: DC

## 2018-11-19 NOTE — Telephone Encounter (Signed)
Patient needs this Rx refilled from the ER   amLODipine (NORVASC) 5 MG tablet  Can you refill 90 days?     Sent to:   [facility] [place], [place] AT [address] (Phone) [phone] (Fax)

## 2018-11-19 NOTE — Telephone Encounter (Signed)
Noted  

## 2018-11-19 NOTE — Telephone Encounter (Signed)
done

## 2018-11-22 ENCOUNTER — Other Ambulatory Visit (INDEPENDENT_AMBULATORY_CARE_PROVIDER_SITE_OTHER): Payer: Medicare Other

## 2018-11-22 DIAGNOSIS — R7309 Other abnormal glucose: Secondary | ICD-10-CM

## 2018-11-22 LAB — HEMOGLOBIN A1C: Hgb A1c MFr Bld: 6 % (ref 4.6–6.5)

## 2018-11-23 ENCOUNTER — Other Ambulatory Visit: Payer: Self-pay

## 2018-11-26 ENCOUNTER — Encounter: Payer: Self-pay | Admitting: Family Medicine

## 2018-11-26 ENCOUNTER — Ambulatory Visit: Payer: Medicare Other

## 2018-11-26 ENCOUNTER — Other Ambulatory Visit: Payer: Self-pay

## 2018-11-26 ENCOUNTER — Ambulatory Visit (INDEPENDENT_AMBULATORY_CARE_PROVIDER_SITE_OTHER): Payer: Medicare Other | Admitting: Family Medicine

## 2018-11-26 VITALS — BP 160/80 | HR 107 | Temp 98.0°F | Ht 62.75 in | Wt 175.1 lb

## 2018-11-26 DIAGNOSIS — E785 Hyperlipidemia, unspecified: Secondary | ICD-10-CM

## 2018-11-26 DIAGNOSIS — Z86010 Personal history of colonic polyps: Secondary | ICD-10-CM | POA: Diagnosis not present

## 2018-11-26 DIAGNOSIS — I10 Essential (primary) hypertension: Secondary | ICD-10-CM | POA: Diagnosis not present

## 2018-11-26 DIAGNOSIS — Z0000 Encounter for general adult medical examination without abnormal findings: Secondary | ICD-10-CM | POA: Diagnosis not present

## 2018-11-26 MED ORDER — LOSARTAN POTASSIUM 50 MG PO TABS: 50.0000 mg | ORAL_TABLET | Freq: Every day | ORAL | 1 refills | Status: DC

## 2018-11-26 MED ORDER — ROSUVASTATIN CALCIUM 20 MG PO TABS: 20.0000 mg | ORAL_TABLET | Freq: Every day | ORAL | 1 refills | Status: DC

## 2018-11-26 NOTE — Progress Notes (Signed)
Sheila Ramsey DOB: 22-Sep-1950 Encounter date: 11/26/2018  This is a 68 y.o. female who presents for complete physical   History of present illness/Additional concerns: Hypertension: Amlodipine 5 mg daily, losartan 50 mg daily. Can feel it elevate when she comes into office.   Hyperlipidemia: Crestor 20 mg daily  Just completed blood work this month.  A lot of increased stress. Friend passed, brother in law passed. Worried about heart issues and would like to see cardiology at some point.  Had pneumonia vaccine with previous provider.   Has had both hips replaced. Mindful of this. Sometimes right hip gives her some difficulty. Following with him about q2 years ([place])  Still sees gyn yearly, Dr. Sharlet Salina.  Colonoscopy done in Jan; 3 year repeat Mammogram 08/2018 normal  2 years ago had ekg, echo, and DEXA   Past Medical History:  Diagnosis Date  . Arthritis    "hips, knees" (12/10/2013)  . Cataract   . Diverticular disease   . Heart murmur   . High cholesterol   . Hypertension   . Kidney stones   . Macular degeneration   . OSA on CPAP   . PONV (postoperative nausea and vomiting)   . Sleep apnea    Past Surgical History:  Procedure Laterality Date  . EXTRACORPOREAL SHOCK WAVE LITHOTRIPSY Left    had stent placed and then removed prior to lith  . EYE SURGERY    . JOINT REPLACEMENT    . KNEE ARTHROSCOPY Left 2014  . TONSILLECTOMY    . TOTAL HIP ARTHROPLASTY Right 09/27/2013   Procedure: RIGHT TOTAL HIP ARTHROPLASTY ANTERIOR APPROACH;  Surgeon: Gearlean Alf, MD;  Location: [facility];  Service: Orthopedics;  Laterality: Right;  . TOTAL HIP ARTHROPLASTY Left 12/10/2013   Procedure: LEFT TOTAL HIP ARTHROPLASTY ANTERIOR APPROACH;  Surgeon: Gearlean Alf, MD;  Location: [facility];  Service: Orthopedics;  Laterality: Left;   Allergies  Allergen Reactions  . Hydrocodone Nausea Only  . Oxycodone Nausea Only   Current Meds  Medication Sig  . amLODipine (NORVASC) 5 MG tablet  Take 1 tablet (5 mg total) by mouth daily.  . Cholecalciferol (VITAMIN D3 PO) Take 5,000 Units by mouth.  . Coenzyme Q10 (COQ10) 100 MG CAPS Take by mouth.  . losartan (COZAAR) 50 MG tablet Take 1 tablet (50 mg total) by mouth daily.  Marland Kitchen MAGNESIUM PO Take by mouth.  . Multiple Vitamins-Minerals (MULTIVITAMIN ADULTS PO) Take by mouth.  . Multiple Vitamins-Minerals (PRESERVISION AREDS PO) Take by mouth.  . rosuvastatin (CRESTOR) 20 MG tablet Take 1 tablet (20 mg total) by mouth daily.  . Zolpidem Tartrate (AMBIEN PO) Take 10 mg by mouth. Take 1/4 tablet by mouth at night   . [DISCONTINUED] losartan (COZAAR) 50 MG tablet TAKE 1 TABLET BY MOUTH EVERY DAY  . [DISCONTINUED] rosuvastatin (CRESTOR) 20 MG tablet TAKE 1 TABLET BY MOUTH EVERY DAY   Social History   Tobacco Use  . Smoking status: Former Smoker    Types: Cigarettes  . Smokeless tobacco: Never Used  . Tobacco comment: "socially smoked; quit smoking in the 1990's"  Substance Use Topics  . Alcohol use: Yes    Comment: occ   Family History  Problem Relation Age of Onset  . Dementia Mother        NPH, Lewey Body Dementia  . Hypertension Mother   . HIV/AIDS Brother   . Other Maternal Grandmother        nasal cancer, snuff  . Stroke Maternal  Grandfather   . Early death Paternal Grandmother      Review of Systems  Constitutional: Negative for activity change, appetite change, chills, fatigue, fever and unexpected weight change.  HENT: Negative for congestion, ear pain, hearing loss, sinus pressure, sinus pain, sore throat and trouble swallowing.   Eyes: Negative for pain and visual disturbance.  Respiratory: Negative for cough, chest tightness, shortness of breath and wheezing.   Cardiovascular: Negative for chest pain, palpitations and leg swelling.  Gastrointestinal: Negative for abdominal pain, blood in stool, constipation, diarrhea, nausea and vomiting.  Genitourinary: Negative for difficulty urinating and menstrual problem.   Musculoskeletal: Negative for arthralgias and back pain.  Skin: Negative for rash.  Neurological: Negative for dizziness, weakness, numbness and headaches.  Hematological: Negative for adenopathy. Does not bruise/bleed easily.  Psychiatric/Behavioral: Negative for sleep disturbance and suicidal ideas. The patient is not nervous/anxious.     CBC:  Lab Results  Component Value Date   WBC 3.8 (L) 11/19/2018   HGB 15.7 (H) 11/19/2018   HCT 45.5 11/19/2018   MCH 32.0 10/30/2018   MCHC 34.4 11/19/2018   RDW 12.6 11/19/2018   PLT 288.0 11/19/2018   CMP: Lab Results  Component Value Date   NA 140 11/19/2018   K 4.3 11/19/2018   CL 103 11/19/2018   CO2 29 11/19/2018   ANIONGAP 10 10/30/2018   GLUCOSE 164 (H) 11/19/2018   BUN 14 11/19/2018   CREATININE 0.80 11/19/2018   GFRAA >60 10/30/2018   CALCIUM 9.9 11/19/2018   PROT 7.2 11/19/2018   BILITOT 0.9 11/19/2018   ALKPHOS 54 11/19/2018   ALT 18 11/19/2018   AST 17 11/19/2018   LIPID: Lab Results  Component Value Date   CHOL 165 11/19/2018   TRIG 117.0 11/19/2018   HDL 56.70 11/19/2018   LDLCALC 85 11/19/2018    Objective:  BP (!) 160/80 (BP Location: Left Arm, Cuff Size: Normal)   Pulse (!) 107   Temp 98 F (36.7 C) (Temporal)   Ht 5' 2.75" (1.594 m)   Wt 175 lb 1.6 oz (79.4 kg)   SpO2 98%   BMI 31.27 kg/m   Weight: 175 lb 1.6 oz (79.4 kg)   BP Readings from Last 3 Encounters:  11/26/18 (!) 160/80  10/30/18 (!) 209/92  10/30/18 (!) 213/99   Wt Readings from Last 3 Encounters:  11/26/18 175 lb 1.6 oz (79.4 kg)  10/30/18 178 lb (80.7 kg)  12/10/13 197 lb (89.4 kg)    Physical Exam Constitutional:      General: She is not in acute distress.    Appearance: She is well-developed.  HENT:     Head: Normocephalic and atraumatic.     Right Ear: External ear normal.     Left Ear: External ear normal.     Mouth/Throat:     Pharynx: No oropharyngeal exudate.  Eyes:     Conjunctiva/sclera: Conjunctivae  normal.     Pupils: Pupils are equal, round, and reactive to light.  Neck:     Musculoskeletal: Normal range of motion and neck supple.     Thyroid: No thyromegaly.  Cardiovascular:     Rate and Rhythm: Normal rate and regular rhythm.     Heart sounds: Normal heart sounds. No murmur. No friction rub. No gallop.   Pulmonary:     Effort: Pulmonary effort is normal.     Breath sounds: Normal breath sounds.  Abdominal:     General: Bowel sounds are normal. There is no distension.  Palpations: Abdomen is soft. There is no mass.     Tenderness: There is no abdominal tenderness. There is no guarding.     Hernia: No hernia is present.  Musculoskeletal: Normal range of motion.        General: No tenderness or deformity.  Lymphadenopathy:     Cervical: No cervical adenopathy.  Skin:    General: Skin is warm and dry.     Findings: No rash.  Neurological:     Mental Status: She is alert and oriented to person, place, and time.     Deep Tendon Reflexes: Reflexes normal.     Reflex Scores:      Tricep reflexes are 2+ on the right side and 2+ on the left side.      Bicep reflexes are 2+ on the right side and 2+ on the left side.      Brachioradialis reflexes are 2+ on the right side and 2+ on the left side.      Patellar reflexes are 2+ on the right side and 2+ on the left side. Psychiatric:        Speech: Speech normal.        Behavior: Behavior normal.        Thought Content: Thought content normal.     Assessment/Plan: Health Maintenance Due  Topic Date Due  . Hepatitis C Screening  1950/08/23  . PNA vac Low Risk Adult (2 of 2 - PPSV23) 06/04/2018   Health Maintenance reviewed.  1. Preventative health care Keep up with healthy lifestyle.   2. Essential hypertension bp elevated today. Asked her to monitor at home and update me in 2 weeks time.   3. PERSONAL HX COLONIC POLYPS Due for repeat colonoscopy in 3 years time.   4. Hyperlipidemia, unspecified hyperlipidemia  type Continue with crestor daily.  Return for pending blood pressure report.  Micheline Rough, MD

## 2018-11-26 NOTE — Patient Instructions (Signed)
Please check pressures at home no more than twice daily and send back numbers in 2 weeks time.

## 2018-12-06 ENCOUNTER — Ambulatory Visit (INDEPENDENT_AMBULATORY_CARE_PROVIDER_SITE_OTHER): Payer: Medicare Other

## 2018-12-06 ENCOUNTER — Other Ambulatory Visit: Payer: Self-pay

## 2018-12-06 VITALS — BP 135/58 | Ht 63.0 in | Wt 175.0 lb

## 2018-12-06 DIAGNOSIS — Z23 Encounter for immunization: Secondary | ICD-10-CM | POA: Diagnosis not present

## 2018-12-06 DIAGNOSIS — Z0000 Encounter for general adult medical examination without abnormal findings: Secondary | ICD-10-CM

## 2018-12-06 NOTE — Progress Notes (Addendum)
This visit is being conducted via phone call due to the COVID-19 pandemic. This patient has given me verbal consent via phone to conduct this visit, patient states they are participating from their home address. Some vital signs may be absent or patient reported.   Patient identification: identified by name, DOB, and current address.  Location provider: [facility], Office Persons participating in the virtual visit: Ms. Shany Cabacungan and Franne Forts, LPN.    Subjective:   Sheila Ramsey is a 68 y.o. female who presents for Medicare Annual (Subsequent) preventive examination.  Review of Systems:  Cardiac Risk Factors include: advanced age (>84men, >3 women);hypertension;obesity (BMI >30kg/m2)     Objective:     Vitals: BP (!) 135/58 Comment: patient reported  Ht 5\' 3"  (1.6 m)   Wt 175 lb (79.4 kg) Comment: patient reported  BMI 31.00 kg/m   Body mass index is 31 kg/m.  Advanced Directives 12/06/2018 10/30/2018 01/30/2017 12/10/2013 12/02/2013 09/27/2013  Does Patient Have a Medical Advance Directive? No Yes No No No Yes  Does patient want to make changes to medical advance directive? - - - - - No - Patient declined  Would patient like information on creating a medical advance directive? Yes (MAU/Ambulatory/Procedural Areas - Information given) - - No - patient declined information - -    Tobacco Social History   Tobacco Use  Smoking Status Former Smoker  . Types: Cigarettes  Smokeless Tobacco Never Used  Tobacco Comment   "socially smoked; quit smoking in the 1990's"     Counseling given: Not Answered Comment: "socially smoked; quit smoking in the 1990's"   Clinical Intake:  Pre-visit preparation completed: Yes  Pain : No/denies pain     Nutritional Status: BMI > 30  Obese Nutritional Risks: None Diabetes: No  How often do you need to have someone help you when you read instructions, pamphlets, or other written materials from your doctor or pharmacy?: 1 -  Never What is the last grade level you completed in school?: 4 years college  Interpreter Needed?: No  Information entered by :: Franne Forts, LPN.  Past Medical History:  Diagnosis Date  . Arthritis    "hips, knees" (12/10/2013)  . Cataract   . Diverticular disease   . Heart murmur   . High cholesterol   . Hypertension   . Kidney stones   . Macular degeneration   . OSA on CPAP   . PONV (postoperative nausea and vomiting)   . Sleep apnea    Past Surgical History:  Procedure Laterality Date  . EXTRACORPOREAL SHOCK WAVE LITHOTRIPSY Left    had stent placed and then removed prior to lith  . EYE SURGERY    . JOINT REPLACEMENT    . KNEE ARTHROSCOPY Left 2014  . TONSILLECTOMY    . TOTAL HIP ARTHROPLASTY Right 09/27/2013   Procedure: RIGHT TOTAL HIP ARTHROPLASTY ANTERIOR APPROACH;  Surgeon: Gearlean Alf, MD;  Location: [facility];  Service: Orthopedics;  Laterality: Right;  . TOTAL HIP ARTHROPLASTY Left 12/10/2013   Procedure: LEFT TOTAL HIP ARTHROPLASTY ANTERIOR APPROACH;  Surgeon: Gearlean Alf, MD;  Location: [facility];  Service: Orthopedics;  Laterality: Left;   Family History  Problem Relation Age of Onset  . Dementia Mother        NPH, Lewey Body Dementia  . Hypertension Mother   . HIV/AIDS Brother   . Other Maternal Grandmother        nasal cancer, snuff  . Stroke Maternal Grandfather   .  Early death Paternal Grandmother    Social History   Socioeconomic History  . Marital status: Widowed    Spouse name: Not on file  . Number of children: 1  . Years of education: 4 years college  . Highest education level: Bachelor's degree (e.g., BA, AB, BS)  Occupational History  . Occupation: retired    Comment: Contractor  Social Needs  . Financial resource strain: Not hard at all  . Food insecurity    Worry: Never true    Inability: Never true  . Transportation needs    Medical: No    Non-medical: No  Tobacco Use  . Smoking status: Former Smoker    Types:  Cigarettes  . Smokeless tobacco: Never Used  . Tobacco comment: "socially smoked; quit smoking in the 1990's"  Substance and Sexual Activity  . Alcohol use: Yes    Comment: occ  . Drug use: No  . Sexual activity: Not on file  Lifestyle  . Physical activity    Days per week: 0 days    Minutes per session: 0 min  . Stress: Not on file  Relationships  . Social Herbalist on phone: Not on file    Gets together: Once a week    Attends religious service: Not on file    Active member of club or organization: Yes    Attends meetings of clubs or organizations: More than 4 times per year    Relationship status: Widowed  Other Topics Concern  . Not on file  Social History Narrative   Husband Ronalee Belts passed away 2 years ago. She belongs to a grief group that has been tremendous support to her. They continue to meet weekly. She has a son who is very supportive and many friends. She also has 1 dog in her home.     Outpatient Encounter Medications as of 12/06/2018  Medication Sig  . amLODipine (NORVASC) 5 MG tablet Take 1 tablet (5 mg total) by mouth daily.  . Cholecalciferol (VITAMIN D3 PO) Take 5,000 Units by mouth.  . Coenzyme Q10 (COQ10) 100 MG CAPS Take by mouth.  . losartan (COZAAR) 50 MG tablet Take 1 tablet (50 mg total) by mouth daily.  Marland Kitchen MAGNESIUM PO Take by mouth.  . Multiple Vitamins-Minerals (MULTIVITAMIN ADULTS PO) Take by mouth.  . Multiple Vitamins-Minerals (PRESERVISION AREDS PO) Take by mouth.  Marland Kitchen OVER THE COUNTER MEDICATION Super Beets 8 oz powder po daily  . OVER THE COUNTER MEDICATION Ultimate Eye Support po daily  . rosuvastatin (CRESTOR) 20 MG tablet Take 1 tablet (20 mg total) by mouth daily.  Marland Kitchen zinc gluconate 50 MG tablet Take 50 mg by mouth daily.  . Zolpidem Tartrate (AMBIEN PO) Take 10 mg by mouth. Take 1/4 tablet by mouth at night    No facility-administered encounter medications on file as of 12/06/2018.     Activities of Daily Living In your present  state of health, do you have any difficulty performing the following activities: 12/06/2018  Hearing? N  Vision? N  Difficulty concentrating or making decisions? N  Walking or climbing stairs? N  Dressing or bathing? N  Doing errands, shopping? N  Preparing Food and eating ? N  Using the Toilet? N  In the past six months, have you accidently leaked urine? N  Do you have problems with loss of bowel control? N  Managing your Medications? N  Managing your Finances? N  Housekeeping or managing your Housekeeping? N  Some  recent data might be hidden    Patient Care Team: Caren Macadam, MD as PCP - General (Family Medicine) Gastroenter[facility], [place] (Gastroenterology) Rhoton, Shelda Altes., MD (Gastroenterology) Hinton Rao, MD (Obstetrics and Gynecology) Colonel Bald, MD (Internal Medicine) Amedeo Kinsman, OD (Optometry)    Assessment:   This is a routine wellness examination for Sheila Ramsey.  Exercise Activities and Dietary recommendations Current Exercise Habits: The patient does not participate in regular exercise at present, Exercise limited by: None identified  Goals    . Have 3 meals a day     Patient stated she doesn't eat as healthy since her husband passed away because he cooked healthy.    . Increase physical activity     Patient would like to return to in-person weight watchers meetings when the pandemic settles. This motivates her and holds her accountable.        Fall Risk Fall Risk  12/06/2018 11/26/2018  Falls in the past year? 0 0  Number falls in past yr: - 0  Injury with Fall? - 0  Follow up Falls evaluation completed;Education provided;Falls prevention discussed -   Is the patient's home free of loose throw rugs in walkways, pet beds, electrical cords, etc?   yes      Grab bars in the bathroom? yes      Handrails on the stairs?   yes      Adequate lighting?   yes  Timed Get Up and Go performed: N/A due to telephone visit  Depression  Screen PHQ 2/9 Scores 12/06/2018 11/26/2018  PHQ - 2 Score 0 0     Cognitive Function      No cognitive concerns at this time  Immunization History  Administered Date(s) Administered  . Influenza, High Dose Seasonal PF 10/11/2017, 09/18/2018  . Influenza-Unspecified 11/03/2013, 12/21/2016  . Tdap 01/26/2018  . Zoster 11/05/2014  . Zoster Recombinat (Shingrix) 08/04/2016, 02/05/2017    Qualifies for Shingles Vaccine? Patient reports she has already received Shingrix  Screening Tests Health Maintenance  Topic Date Due  . Hepatitis C Screening  1950-07-26  . PNA vac Low Risk Adult (2 of 2 - PPSV23) 06/04/2018  . MAMMOGRAM  08/05/2020  . COLONOSCOPY  01/03/2021  . TETANUS/TDAP  01/27/2028  . INFLUENZA VACCINE  Completed  . DEXA SCAN  Completed    Cancer Screenings: Lung: Low Dose CT Chest recommended if Age 9-80 years, 30 pack-year currently smoking OR have quit w/in 15years. Patient does not qualify. Breast:  Up to date on Mammogram? Yes   Up to date of Bone Density/Dexa? Yes Colorectal: Yes  Additional Screenings:  Hepatitis C Screening: Patient states this was done at previous PCP office.     Plan:   Ms. Quinter is socially active and also careful to socially distance/mask wearing. She monitors her blood pressures at home and reports an average of 135/58; a low of 113/4/70, and high of 168/57. She stays busy with home improvement projects. She stays in touch with her son and daughter-in-law who reside in [place], [place]. She plans to start back at weight watchers in person meetings when it is safe to do so. She understands that holds her accountable for her exercise and nutritional choices. After careful review of previous PCP records, I did not find Hepatitis C screening or record of PPS-23 vaccine.   I have personally reviewed and noted the following in the patient's chart:   . Medical and social history . Use of alcohol, tobacco or illicit  drugs  . Current  medications and supplements . Functional ability and status . Nutritional status . Physical activity . Advanced directives . List of other physicians . Hospitalizations, surgeries, and ER visits in previous 12 months . Vitals . Screenings to include cognitive, depression, and falls . Referrals and appointments  In addition, I have reviewed and discussed with patient certain preventive protocols, quality metrics, and best practice recommendations. A written personalized care plan for preventive services as well as general preventive health recommendations were provided to patient.     Franne Forts, LPN  [phone]

## 2018-12-06 NOTE — Addendum Note (Signed)
Addended by: Christiane Ha on: 12/06/2018 03:37 PM   Modules accepted: Orders

## 2018-12-06 NOTE — Patient Instructions (Addendum)
[name] , Thank you for taking time to participate in your Medicare Wellness Visit. I appreciate your ongoing commitment to your health goals. Please review the following plan we discussed and let me know if I can assist you in the future.   Screening recommendations/referrals: Colorectal Screening: performed on 01-14-2018; up to date; due again 01-15-2021 Mammogram: performed 08-06-2018; up to date; due again 08-07-2019 Bone Density: patient states this was completed in 2018  Vision and Dental Exams: Recommended annual ophthalmology exams for early detection of glaucoma and other disorders of the eye. She stated she sees ophthalmology twice yearly for macular degeneration. Recommended annual dental exams for proper oral hygiene.   Diabetic Exams: Diabetic Eye Exam: N/A Diabetic Foot Exam: N/A  Vaccinations: Influenza vaccine: performed 09-18-2018; up to date; due fall 2021 Pneumococcal vaccine: patient states she completed this at her previous PCP office.  Tdap vaccine: performed 01-26-2018; up to date; due again 01-27-2028 Shingles vaccine: Patient stated the Shingrix series of 2 injections was performed at her previous PCP office.  Advanced directives:  I have provided a copy for you to complete at home and have notarized. Once this is complete please bring a copy in to our office so we can scan it into your chart. Goals: Recommend to drink at least 6-8 8oz glasses of water per day. Recommend to exercise for at least 150 minutes per week. Recommend to decrease portion sizes by eating 3 small healthy meals and at least 2 healthy snacks per day. Next appointment: Please schedule your Annual Wellness Visit with your Nurse Health Advisor in one year.  Preventive Care 67 Years and Older, Female Preventive care refers to lifestyle choices and visits with your health care provider that can promote health and wellness. What does preventive care include?  A yearly physical exam. This is also called  an annual well check.  Dental exams once or twice a year.  Routine eye exams. Ask your health care provider how often you should have your eyes checked.  Personal lifestyle choices, including:  Daily care of your teeth and gums.  Regular physical activity.  Eating a healthy diet.  Avoiding tobacco and drug use.  Limiting alcohol use.  Practicing safe sex.  Taking low-dose aspirin every day if recommended by your health care provider.  Taking vitamin and mineral supplements as recommended by your health care provider. What happens during an annual well check? The services and screenings done by your health care provider during your annual well check will depend on your age, overall health, lifestyle risk factors, and family history of disease. Counseling  Your health care provider may ask you questions about your:  Alcohol use.  Tobacco use.  Drug use.  Emotional well-being.  Home and relationship well-being.  Sexual activity.  Eating habits.  History of falls.  Memory and ability to understand (cognition).  Work and work Statistician.  Reproductive health. Screening  You may have the following tests or measurements:  Height, weight, and BMI.  Blood pressure.  Lipid and cholesterol levels. These may be checked every 5 years, or more frequently if you are over 37 years old.  Skin check.  Lung cancer screening. You may have this screening every year starting at age 43 if you have a 30-pack-year history of smoking and currently smoke or have quit within the past 15 years.  Fecal occult blood test (FOBT) of the stool. You may have this test every year starting at age 60.  Flexible sigmoidoscopy or colonoscopy.  You may have a sigmoidoscopy every 5 years or a colonoscopy every 10 years starting at age 66.  Hepatitis C blood test.  Hepatitis B blood test.  Sexually transmitted disease (STD) testing.  Diabetes screening. This is done by checking your  blood sugar (glucose) after you have not eaten for a while (fasting). You may have this done every 1-3 years.  Bone density scan. This is done to screen for osteoporosis. You may have this done starting at age 59.  Mammogram. This may be done every 1-2 years. Talk to your health care provider about how often you should have regular mammograms. Talk with your health care provider about your test results, treatment options, and if necessary, the need for more tests. Vaccines  Your health care provider may recommend certain vaccines, such as:  Influenza vaccine. This is recommended every year.  Tetanus, diphtheria, and acellular pertussis (Tdap, Td) vaccine. You may need a Td booster every 10 years.  Zoster vaccine. You may need this after age 58.  Pneumococcal 13-valent conjugate (PCV13) vaccine. One dose is recommended after age 70.  Pneumococcal polysaccharide (PPSV23) vaccine. One dose is recommended after age 19. Talk to your health care provider about which screenings and vaccines you need and how often you need them. This information is not intended to replace advice given to you by your health care provider. Make sure you discuss any questions you have with your health care provider. Document Released: 01/16/2015 Document Revised: 09/09/2015 Document Reviewed: 10/21/2014 Elsevier Interactive Patient Education  2017 [place] Prevention in the Home Falls can cause injuries. They can happen to people of all ages. There are many things you can do to make your home safe and to help prevent falls. What can I do on the outside of my home?  Regularly fix the edges of walkways and driveways and fix any cracks.  Remove anything that might make you trip as you walk through a door, such as a raised step or threshold.  Trim any bushes or trees on the path to your home.  Use bright outdoor lighting.  Clear any walking paths of anything that might make someone trip, such as rocks  or tools.  Regularly check to see if handrails are loose or broken. Make sure that both sides of any steps have handrails.  Any raised decks and porches should have guardrails on the edges.  Have any leaves, snow, or ice cleared regularly.  Use sand or salt on walking paths during winter.  Clean up any spills in your garage right away. This includes oil or grease spills. What can I do in the bathroom?  Use night lights.  Install grab bars by the toilet and in the tub and shower. Do not use towel bars as grab bars.  Use non-skid mats or decals in the tub or shower.  If you need to sit down in the shower, use a plastic, non-slip stool.  Keep the floor dry. Clean up any water that spills on the floor as soon as it happens.  Remove soap buildup in the tub or shower regularly.  Attach bath mats securely with double-sided non-slip rug tape.  Do not have throw rugs and other things on the floor that can make you trip. What can I do in the bedroom?  Use night lights.  Make sure that you have a light by your bed that is easy to reach.  Do not use any sheets or blankets that are too big  for your bed. They should not hang down onto the floor.  Have a firm chair that has side arms. You can use this for support while you get dressed.  Do not have throw rugs and other things on the floor that can make you trip. What can I do in the kitchen?  Clean up any spills right away.  Avoid walking on wet floors.  Keep items that you use a lot in easy-to-reach places.  If you need to reach something above you, use a strong step stool that has a grab bar.  Keep electrical cords out of the way.  Do not use floor polish or wax that makes floors slippery. If you must use wax, use non-skid floor wax.  Do not have throw rugs and other things on the floor that can make you trip. What can I do with my stairs?  Do not leave any items on the stairs.  Make sure that there are handrails on both  sides of the stairs and use them. Fix handrails that are broken or loose. Make sure that handrails are as long as the stairways.  Check any carpeting to make sure that it is firmly attached to the stairs. Fix any carpet that is loose or worn.  Avoid having throw rugs at the top or bottom of the stairs. If you do have throw rugs, attach them to the floor with carpet tape.  Make sure that you have a light switch at the top of the stairs and the bottom of the stairs. If you do not have them, ask someone to add them for you. What else can I do to help prevent falls?  Wear shoes that:  Do not have high heels.  Have rubber bottoms.  Are comfortable and fit you well.  Are closed at the toe. Do not wear sandals.  If you use a stepladder:  Make sure that it is fully opened. Do not climb a closed stepladder.  Make sure that both sides of the stepladder are locked into place.  Ask someone to hold it for you, if possible.  Clearly mark and make sure that you can see:  Any grab bars or handrails.  First and last steps.  Where the edge of each step is.  Use tools that help you move around (mobility aids) if they are needed. These include:  Canes.  Walkers.  Scooters.  Crutches.  Turn on the lights when you go into a dark area. Replace any light bulbs as soon as they burn out.  Set up your furniture so you have a clear path. Avoid moving your furniture around.  If any of your floors are uneven, fix them.  If there are any pets around you, be aware of where they are.  Review your medicines with your doctor. Some medicines can make you feel dizzy. This can increase your chance of falling. Ask your doctor what other things that you can do to help prevent falls. This information is not intended to replace advice given to you by your health care provider. Make sure you discuss any questions you have with your health care provider. Document Released: 10/16/2008 Document Revised:  05/28/2015 Document Reviewed: 01/24/2014 Elsevier Interactive Patient Education  2017 [employer].

## 2018-12-17 ENCOUNTER — Telehealth: Payer: Self-pay | Admitting: *Deleted

## 2018-12-17 NOTE — Telephone Encounter (Signed)
Copied from [place] [id number]. Topic: General - Other >> Dec 17, 2018  9:50 AM Reyne Dumas L wrote: Reason for CRM:  Pt states that wellness nurse asked her to call back with pneumonia vaccination date. Given 11/2017 by Dr. Coralyn Mark at [place] at [place] >> Dec 17, 2018 12:32 PM [place], [place] D wrote: Pt called back with Her Hep C information Hep C-Negative Results 11/11/2016

## 2019-04-16 ENCOUNTER — Other Ambulatory Visit: Payer: Self-pay | Admitting: *Deleted

## 2019-04-16 MED ORDER — AMLODIPINE BESYLATE 5 MG PO TABS: 5.0000 mg | ORAL_TABLET | Freq: Every day | ORAL | 0 refills | Status: DC

## 2019-04-16 NOTE — Telephone Encounter (Signed)
Rx done. 

## 2019-04-23 ENCOUNTER — Telehealth: Payer: Self-pay | Admitting: *Deleted

## 2019-04-23 NOTE — Telephone Encounter (Signed)
Fax received from [facility] stating a prior Josem Kaufmann is needed for Amlodipine besylate 5mg .  Prior auth sent via [email or web address] and message received stating the Rx is available without authorization.  Spoke with Makayla, the pharmacist and she stated a PA is not needed as the it is too early for the Rx to be filled and she will contact the pt.

## 2019-07-18 ENCOUNTER — Other Ambulatory Visit: Payer: Self-pay | Admitting: Family Medicine

## 2019-07-21 ENCOUNTER — Other Ambulatory Visit: Payer: Self-pay | Admitting: Family Medicine

## 2019-08-07 ENCOUNTER — Other Ambulatory Visit: Payer: Self-pay

## 2019-08-07 ENCOUNTER — Telehealth (INDEPENDENT_AMBULATORY_CARE_PROVIDER_SITE_OTHER): Payer: Medicare PPO | Admitting: Internal Medicine

## 2019-08-07 DIAGNOSIS — J069 Acute upper respiratory infection, unspecified: Secondary | ICD-10-CM | POA: Diagnosis not present

## 2019-08-07 NOTE — Progress Notes (Signed)
Virtual Visit via Telephone Note  I connected with [name] on 08/07/19 at  1:30 PM EDT by telephone and verified that I am speaking with the correct person using two identifiers.   I discussed the limitations, risks, security and privacy concerns of performing an evaluation and management service by telephone and the availability of in person appointments. I also discussed with the patient that there may be a patient responsible charge related to this service. The patient expressed understanding and agreed to proceed.  Location patient: home Location provider: work office Participants present for the call: patient, provider Patient did not have a visit in the prior 7 days to address this/these issue(s).   History of Present Illness:  Sheila Ramsey has been having cold-like symptoms for about 3 weeks now.  She first noticed these after mowing the lawn and she thought they were allergy symptoms.  Now she has significant nasal congestion without rhinorrhea, she has a nonproductive cough, she denies fever, chills, shortness of breath.  She does have some upper tooth pain.  She does not have pain ear pain.  She has been fully vaccinated against Covid.   Observations/Objective: Patient sounds cheerful and well on the phone. I do not appreciate any increased work of breathing. Speech and thought processing are grossly intact. Patient reported vitals: None reported   Current Outpatient Medications:  .  amLODipine (NORVASC) 5 MG tablet, TAKE 1 TABLET(5 MG) BY MOUTH DAILY, Disp: 90 tablet, Rfl: 0 .  Cholecalciferol (VITAMIN D3 PO), Take 5,000 Units by mouth., Disp: , Rfl:  .  Coenzyme Q10 (COQ10) 100 MG CAPS, Take by mouth., Disp: , Rfl:  .  losartan (COZAAR) 50 MG tablet, TAKE 1 TABLET(50 MG) BY MOUTH DAILY, Disp: 90 tablet, Rfl: 0 .  MAGNESIUM PO, Take by mouth., Disp: , Rfl:  .  Multiple Vitamins-Minerals (MULTIVITAMIN ADULTS PO), Take by mouth., Disp: , Rfl:  .  Multiple  Vitamins-Minerals (PRESERVISION AREDS PO), Take by mouth., Disp: , Rfl:  .  OVER THE COUNTER MEDICATION, Super Beets 8 oz powder po daily, Disp: , Rfl:  .  OVER THE COUNTER MEDICATION, Ultimate Eye Support po daily, Disp: , Rfl:  .  rosuvastatin (CRESTOR) 20 MG tablet, Take 1 tablet (20 mg total) by mouth daily., Disp: 90 tablet, Rfl: 1 .  zinc gluconate 50 MG tablet, Take 50 mg by mouth daily., Disp: , Rfl:  .  Zolpidem Tartrate (AMBIEN PO), Take 10 mg by mouth. Take 1/4 tablet by mouth at night , Disp: , Rfl:   Review of Systems:  Constitutional: Denies fever, chills, diaphoresis, appetite change. HEENT: Denies photophobia, eye pain, redness, hearing loss, ear pain, trouble swallowing, neck pain, neck stiffness and tinnitus.   Respiratory: Denies SOB, DOE, cough, chest tightness,  and wheezing.   Cardiovascular: Denies chest pain, palpitations and leg swelling.  Gastrointestinal: Denies nausea, vomiting, abdominal pain, diarrhea, constipation, blood in stool and abdominal distention.  Genitourinary: Denies dysuria, urgency, frequency, hematuria, flank pain and difficulty urinating.  Endocrine: Denies: hot or cold intolerance, sweats, changes in hair or nails, polyuria, polydipsia. Musculoskeletal: Denies myalgias, back pain, joint swelling, arthralgias and gait problem.  Skin: Denies pallor, rash and wound.  Neurological: Denies dizziness, seizures, syncope, weakness, light-headedness, numbness and headaches.  Hematological: Denies adenopathy. Easy bruising, personal or family bleeding history  Psychiatric/Behavioral: Denies suicidal ideation, mood changes, confusion, nervousness, sleep disturbance and agitation   Assessment and Plan:  Upper respiratory tract infection, unspecified type -Given current symtoms, PNA,  pharyngitis, ear infection are not likely, hence abx have not been prescribed. -Have advised rest, fluids, OTC antihistamines, cough suppressants and mucinex. -RTC if no  improvement in 10-14 days.    I discussed the assessment and treatment plan with the patient. The patient was provided an opportunity to ask questions and all were answered. The patient agreed with the plan and demonstrated an understanding of the instructions.   The patient was advised to call back or seek an in-person evaluation if the symptoms worsen or if the condition fails to improve as anticipated.  I provided 15 minutes of non-face-to-face time during this encounter.   Lelon Frohlich, MD [facility]

## 2019-08-16 ENCOUNTER — Other Ambulatory Visit: Payer: Self-pay | Admitting: Family Medicine

## 2019-08-16 ENCOUNTER — Other Ambulatory Visit: Payer: Self-pay

## 2019-08-16 ENCOUNTER — Telehealth (INDEPENDENT_AMBULATORY_CARE_PROVIDER_SITE_OTHER): Payer: Medicare PPO | Admitting: Internal Medicine

## 2019-08-16 ENCOUNTER — Encounter: Payer: Self-pay | Admitting: Internal Medicine

## 2019-08-16 VITALS — BP 180/69 | Temp 99.0°F | Wt 165.0 lb

## 2019-08-16 DIAGNOSIS — J069 Acute upper respiratory infection, unspecified: Secondary | ICD-10-CM | POA: Diagnosis not present

## 2019-08-16 DIAGNOSIS — I10 Essential (primary) hypertension: Secondary | ICD-10-CM | POA: Diagnosis not present

## 2019-08-16 MED ORDER — DOXYCYCLINE HYCLATE 100 MG PO TABS: 100.0000 mg | ORAL_TABLET | Freq: Two times a day (BID) | ORAL | 0 refills | Status: AC

## 2019-08-16 NOTE — Progress Notes (Signed)
Virtual Visit via Telephone Note  I connected with Sheila Ramsey on 08/16/19 at  3:45 PM EDT by telephone and verified that I am speaking with the correct person using two identifiers.   I discussed the limitations, risks, security and privacy concerns of performing an evaluation and management service by telephone and the availability of in person appointments. I also discussed with the patient that there may be a patient responsible charge related to this service. The patient expressed understanding and agreed to proceed.  Location patient: home Location provider: work office Participants present for the call: patient, provider Patient did not have a visit in the prior 7 days to address this/these issue(s).   History of Present Illness:  She has called today to discuss continued chest and head congestion that has now been present for 1 month.  I spoke with her for the same on August 4.  At that time we decided to pursue over-the-counter treatment with antihistamine, guaifenesin, pain relievers and nasal decongestants.  She states she is somewhat improved but continues to be concerned about her congestion.  She has clear nasal discharge.  Her highest temperature has been 99.  She is also starting to get concerned about elevated blood pressures.  These have been ongoing since before her upper respiratory infection.  She wonders if she should take an extra amlodipine.   Observations/Objective: Patient sounds cheerful and well on the phone. I do not appreciate any increased work of breathing. Speech and thought processing are grossly intact. Patient reported vitals: Blood pressure 170/69   Current Outpatient Medications:  .  amLODipine (NORVASC) 5 MG tablet, TAKE 1 TABLET(5 MG) BY MOUTH DAILY, Disp: 90 tablet, Rfl: 0 .  Cholecalciferol (VITAMIN D3 PO), Take 5,000 Units by mouth., Disp: , Rfl:  .  Coenzyme Q10 (COQ10) 100 MG CAPS, Take by mouth., Disp: , Rfl:  .  MAGNESIUM PO, Take by  mouth., Disp: , Rfl:  .  Multiple Vitamins-Minerals (MULTIVITAMIN ADULTS PO), Take by mouth., Disp: , Rfl:  .  Multiple Vitamins-Minerals (PRESERVISION AREDS PO), Take by mouth., Disp: , Rfl:  .  OVER THE COUNTER MEDICATION, Super Beets 8 oz powder po daily, Disp: , Rfl:  .  OVER THE COUNTER MEDICATION, Ultimate Eye Support po daily, Disp: , Rfl:  .  zinc gluconate 50 MG tablet, Take 50 mg by mouth daily., Disp: , Rfl:  .  Zolpidem Tartrate (AMBIEN PO), Take 10 mg by mouth. Take 1/4 tablet by mouth at night , Disp: , Rfl:  .  doxycycline (VIBRA-TABS) 100 MG tablet, Take 1 tablet (100 mg total) by mouth 2 (two) times daily for 7 days., Disp: 14 tablet, Rfl: 0 .  losartan (COZAAR) 50 MG tablet, TAKE 1 TABLET BY MOUTH EVERY DAY, Disp: 90 tablet, Rfl: 0 .  rosuvastatin (CRESTOR) 20 MG tablet, TAKE 1 TABLET BY MOUTH EVERY DAY, Disp: 90 tablet, Rfl: 0  Review of Systems:  Constitutional: Denies fever, chills, diaphoresis, appetite change and fatigue.  HEENT: Denies photophobia, eye pain, redness, hearing loss, ear pain,  mouth sores, trouble swallowing, neck pain, neck stiffness and tinnitus.   Respiratory: Denies SOB, DOE, cough, chest tightness,  and wheezing.   Cardiovascular: Denies chest pain, palpitations and leg swelling.  Gastrointestinal: Denies nausea, vomiting, abdominal pain, diarrhea, constipation, blood in stool and abdominal distention.  Genitourinary: Denies dysuria, urgency, frequency, hematuria, flank pain and difficulty urinating.  Endocrine: Denies: hot or cold intolerance, sweats, changes in hair or nails, polyuria, polydipsia.  Musculoskeletal: Denies myalgias, back pain, joint swelling, arthralgias and gait problem.  Skin: Denies pallor, rash and wound.  Neurological: Denies dizziness, seizures, syncope, weakness, light-headedness, numbness and headaches.  Hematological: Denies adenopathy. Easy bruising, personal or family bleeding history  Psychiatric/Behavioral: Denies  suicidal ideation, mood changes, confusion, nervousness, sleep disturbance and agitation   Assessment and Plan:  Upper respiratory tract infection, unspecified type  -Given chronicity of symptoms, feel it is reasonable to prescribe an antibiotic at this time, will give her Doxy twice daily for 7 days. -She knows to reach back out to [place] if no significant improvement.  Essential hypertension -Have advised her to do ambulatory blood pressure monitoring for the next 4 to 6 weeks and schedule follow-up with her PCP to discuss.  She admits to having significant whitecoat hypertension.  She knows that decongestants can raise blood pressure temporarily.    I discussed the assessment and treatment plan with the patient. The patient was provided an opportunity to ask questions and all were answered. The patient agreed with the plan and demonstrated an understanding of the instructions.   The patient was advised to call back or seek an in-person evaluation if the symptoms worsen or if the condition fails to improve as anticipated.  I provided 22 minutes of non-face-to-face time during this encounter.   Lelon Frohlich, MD [facility]

## 2019-08-24 LAB — HM MAMMOGRAPHY

## 2019-08-27 ENCOUNTER — Encounter: Payer: Self-pay | Admitting: Family Medicine

## 2019-10-01 ENCOUNTER — Other Ambulatory Visit: Payer: Self-pay

## 2019-10-02 ENCOUNTER — Encounter: Payer: Self-pay | Admitting: Family Medicine

## 2019-10-02 ENCOUNTER — Ambulatory Visit (INDEPENDENT_AMBULATORY_CARE_PROVIDER_SITE_OTHER): Payer: Medicare PPO | Admitting: Family Medicine

## 2019-10-02 ENCOUNTER — Encounter: Payer: Self-pay | Admitting: *Deleted

## 2019-10-02 VITALS — BP 184/80 | HR 70 | Temp 98.3°F | Wt 167.6 lb

## 2019-10-02 DIAGNOSIS — E785 Hyperlipidemia, unspecified: Secondary | ICD-10-CM

## 2019-10-02 DIAGNOSIS — R739 Hyperglycemia, unspecified: Secondary | ICD-10-CM

## 2019-10-02 DIAGNOSIS — I10 Essential (primary) hypertension: Secondary | ICD-10-CM | POA: Diagnosis not present

## 2019-10-02 MED ORDER — LOSARTAN POTASSIUM 50 MG PO TABS: 50.0000 mg | ORAL_TABLET | Freq: Every day | ORAL | 1 refills | Status: DC

## 2019-10-02 MED ORDER — AMLODIPINE BESYLATE 2.5 MG PO TABS: 7.5000 mg | ORAL_TABLET | Freq: Every day | ORAL | 1 refills | Status: DC

## 2019-10-02 NOTE — Progress Notes (Signed)
Sheila Ramsey DOB: Jul 25, 1950 Encounter date: 10/02/2019  This is a 69 y.o. female who presents with Chief Complaint  Patient presents with  . Follow-up    History of present illness: Was seen for virtual visit 08/16/19 for URI; treated with doxy. Elevated bp noted and suggested to f/u with pcp. On amlodipine 5mg , losartan 50mg  for bp. Last bloodwork was in 11/2018. (amlodipine was started last October after going to ER). No swelling in legs. NO chest pain.   When she went through sx of sinus infection, felt better head wise after doxy. Still has some occasional chest congestion. No urge to cough. States she is just a "phlegmy person". Ears feel stopped up.   HL: crestor 20mg . No muscle aches/cramps.   Thinks biggest issue is stress. After losing husband, deaths, pandemic, etc. Son moved in with her and broke up with long term girlfriend. Has started walking this week. Has [employer] and has considered yoga there.    No issues with light headedness, dizziness. No headaches.   Has started balance of nature vitamins.   Allergies  Allergen Reactions  . Hydrocodone Nausea Only  . Oxycodone Nausea Only   Current Meds  Medication Sig  . Cholecalciferol (VITAMIN D3 PO) Take 5,000 Units by mouth.  . Coenzyme Q10 (COQ10) 100 MG CAPS Take by mouth.  . losartan (COZAAR) 50 MG tablet Take 1 tablet (50 mg total) by mouth daily.  Marland Kitchen MAGNESIUM PO Take by mouth.  . Multiple Vitamins-Minerals (MULTIVITAMIN ADULTS PO) Take by mouth.  . Multiple Vitamins-Minerals (PRESERVISION AREDS PO) Take by mouth.  Marland Kitchen OVER THE COUNTER MEDICATION Super Beets 8 oz powder po daily  . OVER THE COUNTER MEDICATION Ultimate Eye Support po daily  . zinc gluconate 50 MG tablet Take 50 mg by mouth daily.  . Zolpidem Tartrate (AMBIEN PO) Take 10 mg by mouth. Take 1/4 tablet by mouth at night   . [DISCONTINUED] amLODipine (NORVASC) 5 MG tablet TAKE 1 TABLET(5 MG) BY MOUTH DAILY  . [DISCONTINUED] losartan (COZAAR) 50 MG tablet  TAKE 1 TABLET BY MOUTH EVERY DAY  . [DISCONTINUED] rosuvastatin (CRESTOR) 20 MG tablet TAKE 1 TABLET BY MOUTH EVERY DAY    Review of Systems  Constitutional: Negative for chills, fatigue and fever.  Respiratory: Negative for cough, chest tightness, shortness of breath and wheezing.   Cardiovascular: Negative for chest pain, palpitations and leg swelling.    Objective:  BP (!) 184/80 (BP Location: Right Arm, Patient Position: Sitting, Cuff Size: Normal)   Pulse 70   Temp 98.3 F (36.8 C) (Oral)   Wt 167 lb 9.6 oz (76 kg)   SpO2 98%   BMI 29.69 kg/m   Weight: 167 lb 9.6 oz (76 kg)   BP Readings from Last 3 Encounters:  10/02/19 (!) 184/80  08/16/19 (!) 180/69  12/06/18 (!) 135/58   Wt Readings from Last 3 Encounters:  10/02/19 167 lb 9.6 oz (76 kg)  08/16/19 165 lb (74.8 kg)  12/06/18 175 lb (79.4 kg)    Physical Exam Constitutional:      General: She is not in acute distress.    Appearance: She is well-developed.  Cardiovascular:     Rate and Rhythm: Normal rate and regular rhythm.     Heart sounds: Normal heart sounds. No murmur heard.  No friction rub.  Pulmonary:     Effort: Pulmonary effort is normal. No respiratory distress.     Breath sounds: Normal breath sounds. No wheezing or rales.  Musculoskeletal:  Right lower leg: No edema.     Left lower leg: No edema.  Neurological:     Mental Status: She is alert and oriented to person, place, and time.  Psychiatric:        Behavior: Behavior normal.     Assessment/Plan  1. Essential hypertension We are going to increase amlodipine to 7.5 mg daily.  Continue losartan 50 mg daily. - amLODipine (NORVASC) 2.5 MG tablet; Take 3 tablets (7.5 mg total) by mouth daily.  Dispense: 270 tablet; Refill: 1 - losartan (COZAAR) 50 MG tablet; Take 1 tablet (50 mg total) by mouth daily.  Dispense: 90 tablet; Refill: 1 - CBC with Differential/Platelet; Future - Comprehensive metabolic panel; Future - Ambulatory referral  to Cardiology - Comprehensive metabolic panel - CBC with Differential/Platelet  2. Hyperlipidemia, unspecified hyperlipidemia type Continue Crestor 20 mg.  We will recheck blood work today. - Lipid panel; Future - Lipid panel  3. Hyperglycemia - Hemoglobin A1c; Future - Hemoglobin A1c    Return in about 1 month (around 11/01/2019) for bp follow up.     Micheline Rough, MD

## 2019-10-03 LAB — COMPREHENSIVE METABOLIC PANEL
AG Ratio: 2.1 (calc) (ref 1.0–2.5)
ALT: 16 U/L (ref 6–29)
AST: 15 U/L (ref 10–35)
Albumin: 4.7 g/dL (ref 3.6–5.1)
Alkaline phosphatase (APISO): 48 U/L (ref 37–153)
BUN: 14 mg/dL (ref 7–25)
CO2: 29 mmol/L (ref 20–32)
Calcium: 9.3 mg/dL (ref 8.6–10.4)
Chloride: 103 mmol/L (ref 98–110)
Creat: 0.8 mg/dL (ref 0.50–0.99)
Globulin: 2.2 g/dL (calc) (ref 1.9–3.7)
Glucose, Bld: 132 mg/dL — ABNORMAL HIGH (ref 65–99)
Potassium: 4.4 mmol/L (ref 3.5–5.3)
Sodium: 140 mmol/L (ref 135–146)
Total Bilirubin: 0.8 mg/dL (ref 0.2–1.2)
Total Protein: 6.9 g/dL (ref 6.1–8.1)

## 2019-10-03 LAB — HEMOGLOBIN A1C
Hgb A1c MFr Bld: 6.1 % of total Hgb — ABNORMAL HIGH (ref <5.7)
Mean Plasma Glucose: 128 (calc)
eAG (mmol/L): 7.1 (calc)

## 2019-10-03 LAB — CBC WITH DIFFERENTIAL/PLATELET
Absolute Monocytes: 347 cells/uL (ref 200–950)
Basophils Absolute: 50 cells/uL (ref 0–200)
Basophils Relative: 1.1 %
Eosinophils Absolute: 32 cells/uL (ref 15–500)
Eosinophils Relative: 0.7 %
HCT: 46.8 % — ABNORMAL HIGH (ref 35.0–45.0)
Hemoglobin: 15.6 g/dL — ABNORMAL HIGH (ref 11.7–15.5)
Lymphs Abs: 932 cells/uL (ref 850–3900)
MCH: 32 pg (ref 27.0–33.0)
MCHC: 33.3 g/dL (ref 32.0–36.0)
MCV: 96.1 fL (ref 80.0–100.0)
MPV: 9.9 fL (ref 7.5–12.5)
Monocytes Relative: 7.7 %
Neutro Abs: 3141 cells/uL (ref 1500–7800)
Neutrophils Relative %: 69.8 %
Platelets: 274 10*3/uL (ref 140–400)
RBC: 4.87 10*6/uL (ref 3.80–5.10)
RDW: 11.8 % (ref 11.0–15.0)
Total Lymphocyte: 20.7 %
WBC: 4.5 10*3/uL (ref 3.8–10.8)

## 2019-10-03 LAB — LIPID PANEL
Cholesterol: 184 mg/dL (ref <200)
HDL: 68 mg/dL (ref >=50)
LDL Cholesterol (Calc): 95 mg/dL (calc)
Non-HDL Cholesterol (Calc): 116 mg/dL (calc) (ref <130)
Total CHOL/HDL Ratio: 2.7 (calc) (ref <5.0)
Triglycerides: 109 mg/dL (ref <150)

## 2019-10-04 ENCOUNTER — Other Ambulatory Visit: Payer: Self-pay | Admitting: Family Medicine

## 2019-10-22 DIAGNOSIS — I10 Essential (primary) hypertension: Secondary | ICD-10-CM | POA: Diagnosis not present

## 2019-11-04 DIAGNOSIS — M545 Low back pain, unspecified: Secondary | ICD-10-CM | POA: Diagnosis not present

## 2019-11-04 DIAGNOSIS — Z96641 Presence of right artificial hip joint: Secondary | ICD-10-CM | POA: Diagnosis not present

## 2019-11-04 DIAGNOSIS — Z96649 Presence of unspecified artificial hip joint: Secondary | ICD-10-CM | POA: Diagnosis not present

## 2019-12-05 DIAGNOSIS — I700 Atherosclerosis of aorta: Secondary | ICD-10-CM | POA: Diagnosis not present

## 2019-12-05 DIAGNOSIS — E785 Hyperlipidemia, unspecified: Secondary | ICD-10-CM | POA: Diagnosis not present

## 2019-12-05 DIAGNOSIS — M5136 Other intervertebral disc degeneration, lumbar region: Secondary | ICD-10-CM | POA: Diagnosis not present

## 2019-12-16 ENCOUNTER — Encounter: Payer: Self-pay | Admitting: Physical Therapy

## 2019-12-16 ENCOUNTER — Ambulatory Visit: Payer: Medicare PPO | Attending: Physical Medicine and Rehabilitation | Admitting: Physical Therapy

## 2019-12-16 ENCOUNTER — Other Ambulatory Visit: Payer: Self-pay

## 2019-12-16 DIAGNOSIS — M5441 Lumbago with sciatica, right side: Secondary | ICD-10-CM | POA: Diagnosis not present

## 2019-12-16 DIAGNOSIS — M6281 Muscle weakness (generalized): Secondary | ICD-10-CM | POA: Diagnosis not present

## 2019-12-16 DIAGNOSIS — M5442 Lumbago with sciatica, left side: Secondary | ICD-10-CM | POA: Insufficient documentation

## 2019-12-16 DIAGNOSIS — M62830 Muscle spasm of back: Secondary | ICD-10-CM | POA: Diagnosis not present

## 2019-12-16 NOTE — Therapy (Signed)
[facility] [place], [place], [address] Phone: [phone]   Fax:  [phone]  Physical Therapy Evaluation  Patient Details  Name: Sheila Ramsey MRN: [id number] Date of Birth: 21-Sep-1950 Referring Provider (PT): Nelva Bush   Encounter Date: 12/16/2019   PT End of Session - 12/16/19 1143    Visit Number 1    Number of Visits 12    Date for PT Re-Evaluation 02/03/20    Authorization Type [employer]    PT Start Time 6237    PT Stop Time 1100    PT Time Calculation (min) 46 min    Activity Tolerance Patient tolerated treatment well    Behavior During Therapy [facility] for tasks assessed/performed           Past Medical History:  Diagnosis Date  . Arthritis    "hips, knees" (12/10/2013)  . Cataract   . Diverticular disease   . Heart murmur   . High cholesterol   . Hypertension   . Kidney stones   . Macular degeneration   . OSA on CPAP   . PONV (postoperative nausea and vomiting)   . Sleep apnea     Past Surgical History:  Procedure Laterality Date  . EXTRACORPOREAL SHOCK WAVE LITHOTRIPSY Left    had stent placed and then removed prior to lith  . EYE SURGERY    . JOINT REPLACEMENT    . KNEE ARTHROSCOPY Left 2014  . TONSILLECTOMY    . TOTAL HIP ARTHROPLASTY Right 09/27/2013   Procedure: RIGHT TOTAL HIP ARTHROPLASTY ANTERIOR APPROACH;  Surgeon: Gearlean Alf, MD;  Location: [facility];  Service: Orthopedics;  Laterality: Right;  . TOTAL HIP ARTHROPLASTY Left 12/10/2013   Procedure: LEFT TOTAL HIP ARTHROPLASTY ANTERIOR APPROACH;  Surgeon: Gearlean Alf, MD;  Location: [facility];  Service: Orthopedics;  Laterality: Left;    There were no vitals filed for this visit.    Subjective Assessment - 12/16/19 1016    Subjective Patient reports that she has DDD and scoliosis.  She reports that she sat in a chair that collapsed in the summer, but is unsure if that is the cause, the recent x-rays showed significant DDD, has  history of bilateral THA's, without any issue, recent x-rays for hips negative.    Limitations Lifting;Sitting;House hold activities    Patient Stated Goals have less pain, be able to do the housework without pain    Currently in Pain? Yes    Pain Score 0-No pain    Pain Location Back   with pain in the hips   Pain Orientation Right;Left    Pain Descriptors / Indicators Aching;Tightness;Tingling;Burning    Pain Type Acute pain    Pain Radiating Towards in the lateral hips, the groin, in the abdomen "Shooting"    Pain Onset More than a month ago    Pain Frequency Intermittent    Aggravating Factors  housework, yardwork, sitting long periods, reports random "zings that take my breath away"  up to 7/10    Pain Relieving Factors rubing, stretching, pain can be 0/10    Effect of Pain on Daily Activities "just really irritating", I have to stop at times              [facility] PT Assessment - 12/16/19 0001      Assessment   Medical Diagnosis LBP and DDD    Referring Provider (PT) Ramos    Onset Date/Surgical Date 11/16/19    Prior Therapy  about 3 years ago for LBP      Precautions   Precaution Comments hx of bilateral THA's both anterior      Balance Screen   Has the patient fallen in the past 6 months No    Has the patient had a decrease in activity level because of a fear of falling?  No    Is the patient reluctant to leave their home because of a fear of falling?  No      Home Environment   Additional Comments has stairs, does housework, yardwork      Prior Function   Level of Independence Independent    Vocation Retired    Leisure no exercise      Mining engineer Comments fwd head, rounded shoulders      ROM / Strength   AROM / PROM / Strength AROM;Strength      AROM   Overall AROM Comments Lumbar ROM WFL's, hip ROM WFL's, c/o stiffness in the trunk with rotation and side bending      Strength   Overall Strength Comments 4-/5 for the hips, some  weakness in the abdominals      Flexibility   Soft Tissue Assessment /Muscle Length yes    Hamstrings mild tightness    Quadriceps mild tightness    Piriformis good      Palpation   Palpation comment she is very tight in the lumbar area and the ITB but non tender                      Objective measurements completed on examination: See above findings.                 PT Short Term Goals - 12/16/19 1148      PT SHORT TERM GOAL #1   Title independent with HEP    Time 2    Period Weeks    Status New             PT Long Term Goals - 12/16/19 1149      PT LONG TERM GOAL #1   Title understand posture and body mechanics    Time 8    Period Weeks    Status New      PT LONG TERM GOAL #2   Title decrease pain 50%    Time 8    Period Weeks    Status New      PT LONG TERM GOAL #3   Title start gym exercises safely    Time 8    Period Weeks    Status New      PT LONG TERM GOAL #4   Title report minimal difficulty with using the vacuum    Time 8    Period Weeks    Status New                  Plan - 12/16/19 1144    Clinical Impression Statement Patient was seen here about 3 years ago for LBP mostly coccyx.  She has history of bilateral THA.  She reports tha tthis summer a chair she went to sit in gave out and she fell, she is not sure if that is how her current issue started, she has some back paina nd tightness and what she describes as zingers in the lateral hips, the groin and the abdomen.  X-rays show DDD and scoliosis.  She is a little weak in the hips  and the abs, She is stiff in the trunk for rotaiton and side bending.  She has significant tightness and spasms in the paraspinals.    Stability/Clinical Decision Making Stable/Uncomplicated    Clinical Decision Making Low    Rehab Potential Good    PT Frequency 2x / week    PT Duration 8 weeks    PT Treatment/Interventions ADLs/Self Care Home Management;Electrical  Stimulation;Traction;Therapeutic activities;Therapeutic exercise;Neuromuscular re-education;Manual techniques;Patient/family education    PT Next Visit Plan could try STM for the spasms, but will need good stability program    Consulted and Agree with Plan of Care Patient           Patient will benefit from skilled therapeutic intervention in order to improve the following deficits and impairments:  Decreased range of motion,Difficulty walking,Increased muscle spasms,Pain,Improper body mechanics,Impaired flexibility,Postural dysfunction,Decreased strength  Visit Diagnosis: Acute bilateral low back pain with bilateral sciatica - Plan: PT plan of care cert/re-cert  Muscle spasm of back - Plan: PT plan of care cert/re-cert  Muscle weakness (generalized) - Plan: PT plan of care cert/re-cert     Problem List Patient Active Problem List   Diagnosis Date Noted  . Encounter for Medicare annual wellness exam 12/06/2018  . Hypertension 05/04/2018  . Hyperlipidemia 05/04/2018  . OA (osteoarthritis) of hip 09/27/2013  . DIVERTICULOSIS-COLON 08/10/2009  . PERSONAL HX COLONIC POLYPS 08/10/2009    Sumner Boast., PT 12/16/2019, 11:54 AM  [address] [place], [place], [address] Phone: [phone]   Fax:  [phone]  Name: Sheila Ramsey MRN: [id number] Date of Birth: 1950/02/28

## 2019-12-16 NOTE — Patient Instructions (Signed)
Access Code: [email or web address] URL: [email or web address] Date: 12/16/2019 Prepared by: Lum Babe  Exercises Supine Single Knee to Chest Stretch - 2 x daily - 7 x weekly - 1 sets - 10 reps - 10 hold Supine Double Knee to Chest - 2 x daily - 7 x weekly - 1 sets - 10 reps - 10 hold Supine Lower Trunk Rotation - 2 x daily - 7 x weekly - 1 sets - 10 reps - 10 hold TL Sidebending Stretch - Single Arm Overhead - 2 x daily - 7 x weekly - 1 sets - 10 reps - 10 hold

## 2019-12-20 ENCOUNTER — Other Ambulatory Visit: Payer: Self-pay

## 2019-12-20 ENCOUNTER — Ambulatory Visit: Payer: Medicare PPO | Admitting: Physical Therapy

## 2019-12-20 DIAGNOSIS — M6281 Muscle weakness (generalized): Secondary | ICD-10-CM

## 2019-12-20 DIAGNOSIS — M5441 Lumbago with sciatica, right side: Secondary | ICD-10-CM

## 2019-12-20 DIAGNOSIS — M5442 Lumbago with sciatica, left side: Secondary | ICD-10-CM | POA: Diagnosis not present

## 2019-12-20 DIAGNOSIS — M62830 Muscle spasm of back: Secondary | ICD-10-CM

## 2019-12-20 NOTE — Therapy (Signed)
[address]. [place], [place], [address] Phone: [phone]   Fax:  [phone]  Physical Therapy Treatment  Patient Details  Name: Sheila Ramsey MRN: [id number] Date of Birth: October 31, 1950 Referring Provider (PT): Nelva Bush   Encounter Date: 12/20/2019   PT End of Session - 12/20/19 1050    Visit Number 2    Number of Visits 12    Date for PT Re-Evaluation 02/03/20    PT Start Time 1010    PT Stop Time 1050    PT Time Calculation (min) 40 min           Past Medical History:  Diagnosis Date  . Arthritis    "hips, knees" (12/10/2013)  . Cataract   . Diverticular disease   . Heart murmur   . High cholesterol   . Hypertension   . Kidney stones   . Macular degeneration   . OSA on CPAP   . PONV (postoperative nausea and vomiting)   . Sleep apnea     Past Surgical History:  Procedure Laterality Date  . EXTRACORPOREAL SHOCK WAVE LITHOTRIPSY Left    had stent placed and then removed prior to lith  . EYE SURGERY    . JOINT REPLACEMENT    . KNEE ARTHROSCOPY Left 2014  . TONSILLECTOMY    . TOTAL HIP ARTHROPLASTY Right 09/27/2013   Procedure: RIGHT TOTAL HIP ARTHROPLASTY ANTERIOR APPROACH;  Surgeon: Gearlean Alf, MD;  Location: [facility];  Service: Orthopedics;  Laterality: Right;  . TOTAL HIP ARTHROPLASTY Left 12/10/2013   Procedure: LEFT TOTAL HIP ARTHROPLASTY ANTERIOR APPROACH;  Surgeon: Gearlean Alf, MD;  Location: [facility];  Service: Orthopedics;  Laterality: Left;    There were no vitals filed for this visit.   Subjective Assessment - 12/20/19 1013    Subjective doing okay- just tired. have done ex some    Currently in Pain? Yes    Pain Score 4     Pain Location Back                             [facility] Adult PT Treatment/Exercise - 12/20/19 0001      Exercises   Exercises Lumbar;Knee/Hip      Lumbar Exercises: Aerobic   Recumbent Bike L 1 5 min      Lumbar Exercises: Seated    Other Seated Lumbar Exercises sit fit pelvic ROM 10 each   LAQ and HS curl on sit fit with tabnd 15 each   Other Seated Lumbar Exercises red tband scap stab on sit ft 0 each 3 way      Lumbar Exercises: Supine   Ab Set 15 reps;3 seconds   with ball   Bridge with Ball Squeeze Compliant;15 reps;3 seconds    Basic Lumbar Stabilization Compliant    Basic Lumbar Stabilization Limitations 8 min    Other Supine Lumbar Exercises feet on ball bridge ,KTC and obl 15 each      Manual Therapy   Manual Therapy Passive ROM    Manual therapy comments BIL trunk rotation very tight    Passive ROM LE and trunk                    PT Short Term Goals - 12/16/19 1148      PT SHORT TERM GOAL #1   Title independent with HEP    Time 2    Period  Weeks    Status New             PT Long Term Goals - 12/16/19 1149      PT LONG TERM GOAL #1   Title understand posture and body mechanics    Time 8    Period Weeks    Status New      PT LONG TERM GOAL #2   Title decrease pain 50%    Time 8    Period Weeks    Status New      PT LONG TERM GOAL #3   Title start gym exercises safely    Time 8    Period Weeks    Status New      PT LONG TERM GOAL #4   Title report minimal difficulty with using the vacuum    Time 8    Period Weeks    Status New                 Plan - 12/20/19 1050    Clinical Impression Statement pt verb doing HEP, also [facility]ed [facility] stretching classes which I encouraged. Pt also asked about MT and no contraindications. pt tolerated initail progressing ofr core stab well but needed cuing for core engagaement    PT Treatment/Interventions ADLs/Self Care Home Management;Electrical Stimulation;Traction;Therapeutic activities;Therapeutic exercise;Neuromuscular re-education;Manual techniques;Patient/family education    PT Next Visit Plan assess and progress core stab and flexibility           Patient will benefit from skilled  therapeutic intervention in order to improve the following deficits and impairments:  Decreased range of motion,Difficulty walking,Increased muscle spasms,Pain,Improper body mechanics,Impaired flexibility,Postural dysfunction,Decreased strength  Visit Diagnosis: Acute bilateral low back pain with bilateral sciatica  Muscle spasm of back  Muscle weakness (generalized)     Problem List Patient Active Problem List   Diagnosis Date Noted  . Encounter for Medicare annual wellness exam 12/06/2018  . Hypertension 05/04/2018  . Hyperlipidemia 05/04/2018  . OA (osteoarthritis) of hip 09/27/2013  . DIVERTICULOSIS-COLON 08/10/2009  . PERSONAL HX COLONIC POLYPS 08/10/2009    Allan Minotti,ANGIE PTA 12/20/2019, 10:53 AM  [address] [place], [place], [address] Phone: [phone]   Fax:  [phone]  Name: Sheila Ramsey MRN: [id number] Date of Birth: 1950/12/16

## 2019-12-23 ENCOUNTER — Other Ambulatory Visit: Payer: Self-pay

## 2019-12-23 ENCOUNTER — Encounter: Payer: Self-pay | Admitting: Physical Therapy

## 2019-12-23 ENCOUNTER — Ambulatory Visit: Payer: Medicare PPO | Admitting: Physical Therapy

## 2019-12-23 DIAGNOSIS — M5442 Lumbago with sciatica, left side: Secondary | ICD-10-CM

## 2019-12-23 DIAGNOSIS — M62830 Muscle spasm of back: Secondary | ICD-10-CM | POA: Diagnosis not present

## 2019-12-23 DIAGNOSIS — M6281 Muscle weakness (generalized): Secondary | ICD-10-CM | POA: Diagnosis not present

## 2019-12-23 DIAGNOSIS — M5441 Lumbago with sciatica, right side: Secondary | ICD-10-CM | POA: Diagnosis not present

## 2019-12-23 NOTE — Therapy (Signed)
[place] [address]. [place], [place], [address] Phone: [phone]   Fax:  [phone]  Physical Therapy Treatment  Patient Details  Name: Sheila Ramsey MRN: [id number] Date of Birth: [birth year] Referring Provider (PT): Nelva Bush   Encounter Date: 12/23/2019   PT End of Session - 12/23/19 1572    Visit Number 3    Number of Visits 12    Date for PT Re-Evaluation 02/03/20    Authorization Type [employer]    PT Start Time 6203    PT Stop Time 1542    PT Time Calculation (min) 57 min    Activity Tolerance Patient tolerated treatment well    Behavior During Therapy [facility] for tasks assessed/performed           Past Medical History:  Diagnosis Date  . Arthritis    "hips, knees" (12/10/2013)  . Cataract   . Diverticular disease   . Heart murmur   . High cholesterol   . Hypertension   . Kidney stones   . Macular degeneration   . OSA on CPAP   . PONV (postoperative nausea and vomiting)   . Sleep apnea     Past Surgical History:  Procedure Laterality Date  . EXTRACORPOREAL SHOCK WAVE LITHOTRIPSY Left    had stent placed and then removed prior to lith  . EYE SURGERY    . JOINT REPLACEMENT    . KNEE ARTHROSCOPY Left 2014  . TONSILLECTOMY    . TOTAL HIP ARTHROPLASTY Right 09/27/2013   Procedure: RIGHT TOTAL HIP ARTHROPLASTY ANTERIOR APPROACH;  Surgeon: Gearlean Alf, MD;  Location: [facility];  Service: Orthopedics;  Laterality: Right;  . TOTAL HIP ARTHROPLASTY Left 12/10/2013   Procedure: LEFT TOTAL HIP ARTHROPLASTY ANTERIOR APPROACH;  Surgeon: Gearlean Alf, MD;  Location: [facility];  Service: Orthopedics;  Laterality: Left;    There were no vitals filed for this visit.   Subjective Assessment - 12/23/19 1445    Subjective Patietn reportst that she has been very sore since the last visit    Currently in Pain? Yes    Pain Score 6     Pain Location Back    Pain Orientation Lower    Pain Descriptors / Indicators Aching;Sore     Aggravating Factors  maybe what we did the last visit                             [facility] Adult PT Treatment/Exercise - 12/23/19 0001      Lumbar Exercises: Aerobic   Nustep level 3 x 5 minutes      Lumbar Exercises: Standing   Other Standing Lumbar Exercises red tband two ways      Lumbar Exercises: Seated   Other Seated Lumbar Exercises PWR moves with overpressure for stretch,      Lumbar Exercises: Supine   Other Supine Lumbar Exercises feet on ball K2C, trunk rotation, isometric abs      Modalities   Modalities Electrical Stimulation;Moist Heat      Moist Heat Therapy   Number Minutes Moist Heat 12 Minutes    Moist Heat Location Lumbar Spine      Electrical Stimulation   Electrical Stimulation Location Lumbar spine    Electrical Stimulation Action IFC    Electrical Stimulation Parameters supine    Electrical Stimulation Goals Pain      Manual Therapy   Manual Therapy Manual Traction  Manual Traction manula sheet traction hold 30 seconds repeat 12 x                    PT Short Term Goals - 12/23/19 1543      PT SHORT TERM GOAL #1   Title independent with HEP    Status Achieved             PT Long Term Goals - 12/16/19 1149      PT LONG TERM GOAL #1   Title understand posture and body mechanics    Time 8    Period Weeks    Status New      PT LONG TERM GOAL #2   Title decrease pain 50%    Time 8    Period Weeks    Status New      PT LONG TERM GOAL #3   Title start gym exercises safely    Time 8    Period Weeks    Status New      PT LONG TERM GOAL #4   Title report minimal difficulty with using the vacuum    Time 8    Period Weeks    Status New                 Plan - 12/23/19 1534    Clinical Impression Statement Patient reports that she has been very sore since Saturday.  She thinks it could have been the stretches on Friday.  I tried some stretches manually with her and manual sheet traction.  She  thought this felt good.  When she left she said, "wow, I am walking better"    PT Next Visit Plan assess and progress core stab and flexibility    Consulted and Agree with Plan of Care Patient           Patient will benefit from skilled therapeutic intervention in order to improve the following deficits and impairments:  Decreased range of motion,Difficulty walking,Increased muscle spasms,Pain,Improper body mechanics,Impaired flexibility,Postural dysfunction,Decreased strength  Visit Diagnosis: Acute bilateral low back pain with bilateral sciatica  Muscle spasm of back  Muscle weakness (generalized)     Problem List Patient Active Problem List   Diagnosis Date Noted  . Encounter for Medicare annual wellness exam 12/06/2018  . Hypertension 05/04/2018  . Hyperlipidemia 05/04/2018  . OA (osteoarthritis) of hip 09/27/2013  . DIVERTICULOSIS-COLON 08/10/2009  . PERSONAL HX COLONIC POLYPS 08/10/2009    Sumner Boast., PT 12/23/2019, 3:45 PM  [place]. [place], [place], [address] Phone: [phone]   Fax:  [phone]  Name: Sheila Ramsey MRN: [id number] Date of Birth: [birth year]

## 2019-12-24 ENCOUNTER — Other Ambulatory Visit: Payer: Self-pay | Admitting: Family Medicine

## 2019-12-25 ENCOUNTER — Ambulatory Visit: Payer: Medicare PPO | Admitting: Physical Therapy

## 2019-12-25 ENCOUNTER — Other Ambulatory Visit: Payer: Self-pay

## 2019-12-25 ENCOUNTER — Encounter: Payer: Self-pay | Admitting: Physical Therapy

## 2019-12-25 DIAGNOSIS — M6281 Muscle weakness (generalized): Secondary | ICD-10-CM

## 2019-12-25 DIAGNOSIS — M62830 Muscle spasm of back: Secondary | ICD-10-CM

## 2019-12-25 DIAGNOSIS — M5441 Lumbago with sciatica, right side: Secondary | ICD-10-CM | POA: Diagnosis not present

## 2019-12-25 DIAGNOSIS — M5442 Lumbago with sciatica, left side: Secondary | ICD-10-CM | POA: Diagnosis not present

## 2019-12-25 NOTE — Therapy (Signed)
[place]. [place], [place], [address] Phone: [phone]   Fax:  [phone]  Physical Therapy Treatment  Patient Details  Name: TYISHA KOSCO MRN: [id number] Date of Birth: 12-May-1950 Referring Provider (PT): Nelva Bush   Encounter Date: 12/25/2019   PT End of Session - 12/25/19 0916    Visit Number 4    Number of Visits 12    Date for PT Re-Evaluation 02/03/20    Authorization Type [employer]    PT Start Time 0840    PT Stop Time 0935    PT Time Calculation (min) 55 min    Activity Tolerance Patient tolerated treatment well    Behavior During Therapy [facility] for tasks assessed/performed           Past Medical History:  Diagnosis Date  . Arthritis    "hips, knees" (12/10/2013)  . Cataract   . Diverticular disease   . Heart murmur   . High cholesterol   . Hypertension   . Kidney stones   . Macular degeneration   . OSA on CPAP   . PONV (postoperative nausea and vomiting)   . Sleep apnea     Past Surgical History:  Procedure Laterality Date  . EXTRACORPOREAL SHOCK WAVE LITHOTRIPSY Left    had stent placed and then removed prior to lith  . EYE SURGERY    . JOINT REPLACEMENT    . KNEE ARTHROSCOPY Left 2014  . TONSILLECTOMY    . TOTAL HIP ARTHROPLASTY Right 09/27/2013   Procedure: RIGHT TOTAL HIP ARTHROPLASTY ANTERIOR APPROACH;  Surgeon: Gearlean Alf, MD;  Location: [place];  Service: Orthopedics;  Laterality: Right;  . TOTAL HIP ARTHROPLASTY Left 12/10/2013   Procedure: LEFT TOTAL HIP ARTHROPLASTY ANTERIOR APPROACH;  Surgeon: Gearlean Alf, MD;  Location: [place];  Service: Orthopedics;  Laterality: Left;    There were no vitals filed for this visit.   Subjective Assessment - 12/25/19 0843    Subjective Patient reports that she was very sore yesterday 8/10 in the L/S area.  Reports that she is feeling better today, thinks that the stretches may be the issue    Currently in Pain? Yes    Pain Score 5      Pain Location Back    Pain Orientation Lower    Pain Descriptors / Indicators Aching;Sore;Tightness;Spasm    Aggravating Factors  the stretches may cause the pain                             [facility] Adult PT Treatment/Exercise - 12/25/19 0001      Lumbar Exercises: Aerobic   Nustep level 3 x 6 minutes      Lumbar Exercises: Seated   Other Seated Lumbar Exercises PWR moves with overpressure for stretch,      Modalities   Modalities Cryotherapy      Cryotherapy   Number Minutes Cryotherapy 12 Minutes    Cryotherapy Location Hip;Lumbar Spine    Type of Cryotherapy Ice pack      Electrical Stimulation   Electrical Stimulation Location right piriformis area    Electrical Stimulation Action IFC    Electrical Stimulation Parameters prone    Electrical Stimulation Goals Pain      Manual Therapy   Manual Therapy Soft tissue mobilization    Soft tissue mobilization to the lumbar and buttock area, focus on the right piriformis  PT Short Term Goals - 12/25/19 0918      PT SHORT TERM GOAL #1   Title independent with HEP    Status Achieved             PT Long Term Goals - 12/25/19 0918      PT LONG TERM GOAL #1   Title understand posture and body mechanics    Status On-going      PT LONG TERM GOAL #2   Title decrease pain 50%    Status On-going                 Plan - 12/25/19 0917    Clinical Impression Statement Patient seems to have some increase of pain with any activities that we do, unsure if it is the stretches or just the movements, I worked on some knots and spasms in the lumbar area and into the bilateral buttocks today, she does have a significant knot and is the most tender in the right piriformis mm.  I did explain that stability exercises are still things that we need to do.    PT Next Visit Plan could try DN of the right pirifmormis    Consulted and Agree with Plan of Care Patient           Patient  will benefit from skilled therapeutic intervention in order to improve the following deficits and impairments:  Decreased range of motion,Difficulty walking,Increased muscle spasms,Pain,Improper body mechanics,Impaired flexibility,Postural dysfunction,Decreased strength  Visit Diagnosis: Acute bilateral low back pain with bilateral sciatica  Muscle spasm of back  Muscle weakness (generalized)     Problem List Patient Active Problem List   Diagnosis Date Noted  . Encounter for Medicare annual wellness exam 12/06/2018  . Hypertension 05/04/2018  . Hyperlipidemia 05/04/2018  . OA (osteoarthritis) of hip 09/27/2013  . DIVERTICULOSIS-COLON 08/10/2009  . PERSONAL HX COLONIC POLYPS 08/10/2009    Sumner Boast., PT 12/25/2019, 9:19 AM  [place]. [place], [place], [address] Phone: [phone]   Fax:  [phone]  Name: SOTIRIA KEAST MRN: [id number] Date of Birth: 1950/10/13

## 2019-12-26 NOTE — Progress Notes (Deleted)
Subjective:   Sheila Ramsey is a 69 y.o. female who presents for Medicare Annual (Subsequent) preventive examination.  Review of Systems    N/A        Objective:    There were no vitals filed for this visit. There is no height or weight on file to calculate BMI.  Advanced Directives 12/16/2019 12/06/2018 10/30/2018 01/30/2017 12/10/2013 12/02/2013 09/27/2013  Does Patient Have a Medical Advance Directive? No No Yes No No No Yes  Does patient want to make changes to medical advance directive? - - - - - - No - Patient declined  Would patient like information on creating a medical advance directive? No - Patient declined Yes (MAU/Ambulatory/Procedural Areas - Information given) - - No - patient declined information - -    Current Medications (verified) Outpatient Encounter Medications as of 01/06/2020  Medication Sig  . amLODipine (NORVASC) 2.5 MG tablet Take 3 tablets (7.5 mg total) by mouth daily.  . Cholecalciferol (VITAMIN D3 PO) Take 5,000 Units by mouth.  . Coenzyme Q10 (COQ10) 100 MG CAPS Take by mouth.  . losartan (COZAAR) 50 MG tablet Take 1 tablet (50 mg total) by mouth daily.  Marland Kitchen MAGNESIUM PO Take by mouth.  . Multiple Vitamins-Minerals (MULTIVITAMIN ADULTS PO) Take by mouth.  . Multiple Vitamins-Minerals (PRESERVISION AREDS PO) Take by mouth.  Marland Kitchen OVER THE COUNTER MEDICATION Super Beets 8 oz powder po daily  . OVER THE COUNTER MEDICATION Ultimate Eye Support po daily  . rosuvastatin (CRESTOR) 20 MG tablet TAKE 1 TABLET BY MOUTH EVERY DAY  . zinc gluconate 50 MG tablet Take 50 mg by mouth daily.  . Zolpidem Tartrate (AMBIEN PO) Take 10 mg by mouth. Take 1/4 tablet by mouth at night    No facility-administered encounter medications on file as of 01/06/2020.    Allergies (verified) Hydrocodone and Oxycodone   History: Past Medical History:  Diagnosis Date  . Arthritis    "hips, knees" (12/10/2013)  . Cataract   . Diverticular disease   . Heart murmur   . High  cholesterol   . Hypertension   . Kidney stones   . Macular degeneration   . OSA on CPAP   . PONV (postoperative nausea and vomiting)   . Sleep apnea    Past Surgical History:  Procedure Laterality Date  . EXTRACORPOREAL SHOCK WAVE LITHOTRIPSY Left    had stent placed and then removed prior to lith  . EYE SURGERY    . JOINT REPLACEMENT    . KNEE ARTHROSCOPY Left 2014  . TONSILLECTOMY    . TOTAL HIP ARTHROPLASTY Right 09/27/2013   Procedure: RIGHT TOTAL HIP ARTHROPLASTY ANTERIOR APPROACH;  Surgeon: Gearlean Alf, MD;  Location: [place];  Service: Orthopedics;  Laterality: Right;  . TOTAL HIP ARTHROPLASTY Left 12/10/2013   Procedure: LEFT TOTAL HIP ARTHROPLASTY ANTERIOR APPROACH;  Surgeon: Gearlean Alf, MD;  Location: [place];  Service: Orthopedics;  Laterality: Left;   Family History  Problem Relation Age of Onset  . Dementia Mother        NPH, Lewey Body Dementia  . Hypertension Mother   . HIV/AIDS Brother   . Other Maternal Grandmother        nasal cancer, snuff  . Stroke Maternal Grandfather   . Early death Paternal Grandmother    Social History   Socioeconomic History  . Marital status: Widowed    Spouse name: Not on file  . Number of children: 1  . Years of education:  4 years college  . Highest education level: Bachelor's degree (e.g., BA, AB, BS)  Occupational History  . Occupation: retired    Comment: Patent examiner  Tobacco Use  . Smoking status: Former Smoker    Types: Cigarettes  . Smokeless tobacco: Never Used  . Tobacco comment: "socially smoked; quit smoking in the 1990's"  Vaping Use  . Vaping Use: Never used  Substance and Sexual Activity  . Alcohol use: Yes    Comment: occ  . Drug use: No  . Sexual activity: Not on file  Other Topics Concern  . Not on file  Social History Narrative   Husband Kathlene November passed away 2 years ago. She belongs to a grief group that has been tremendous support to her. They continue to meet weekly. She has a son who is  very supportive and many friends. She also has 1 dog in her home.    Social Determinants of Health   Financial Resource Strain: Not on file  Food Insecurity: Not on file  Transportation Needs: Not on file  Physical Activity: Not on file  Stress: Not on file  Social Connections: Not on file    Tobacco Counseling Counseling given: Not Answered Comment: "socially smoked; quit smoking in the 1990's"   Clinical Intake:                 Diabetic?No          Activities of Daily Living No flowsheet data found.  Patient Care Team: Wynn Banker, MD as PCP - General (Family Medicine) Gastroenterology, [facility] (Gastroenterology) [place], Malena Catholic., MD (Gastroenterology) Malena Peer, MD (Obstetrics and Gynecology) Charlette Caffey, MD (Internal Medicine) Hurshel Party, OD (Optometry)  Indicate any recent Medical Services you may have received from other than [facility] providers in the past year (date may be approximate).     Assessment:   This is a routine wellness examination for Sheila Ramsey.  Hearing/Vision screen No exam data present  Dietary issues and exercise activities discussed:    Goals    . Have 3 meals a day     Patient stated she doesn't eat as healthy since her husband passed away because he cooked healthy.    . Increase physical activity     Patient would like to return to in-person weight watchers meetings when the pandemic settles. This motivates her and holds her accountable.       Depression Screen PHQ 2/9 Scores 12/06/2018 11/26/2018  PHQ - 2 Score 0 0    Fall Risk Fall Risk  12/06/2018 11/26/2018  Falls in the past year? 0 0  Number falls in past yr: - 0  Injury with Fall? - 0  Follow up Falls evaluation completed;Education provided;Falls prevention discussed -    FALL RISK PREVENTION PERTAINING TO THE HOME:  Any stairs in or around the home? {YES/NO:21197} If so, are there any without handrails? No  Home free of loose  throw rugs in walkways, pet beds, electrical cords, etc? Yes  Adequate lighting in your home to reduce risk of falls? Yes   ASSISTIVE DEVICES UTILIZED TO PREVENT FALLS:  Life alert? {YES/NO:21197} Use of a cane, walker or w/c? {YES/NO:21197} Grab bars in the bathroom? {YES/NO:21197} Shower chair or bench in shower? {YES/NO:21197} Elevated toilet seat or a handicapped toilet? {YES/NO:21197}    Cognitive Function:        Immunizations Immunization History  Administered Date(s) Administered  . Influenza, High Dose Seasonal PF 10/11/2017, 09/18/2018  . Influenza-Unspecified 11/03/2013,  12/21/2016  . PFIZER SARS-COV-2 Vaccination 02/04/2019, 03/04/2019  . Pneumococcal Polysaccharide-23 11/03/2017  . Tdap 01/26/2018  . Zoster 11/05/2014  . Zoster Recombinat (Shingrix) 08/04/2016, 02/05/2017    TDAP status: Up to date  {Flu Vaccine status:2101806}  Pneumococcal vaccine status: Up to date  Covid-19 vaccine status: Completed vaccines  Qualifies for Shingles Vaccine? Yes   Zostavax completed Yes   Shingrix Completed?: Yes  Screening Tests Health Maintenance  Topic Date Due  . Hepatitis C Screening  Never done  . INFLUENZA VACCINE  08/04/2019  . COVID-19 Vaccine (3 - Booster for Pfizer series) 09/04/2019  . COLONOSCOPY  01/03/2021  . MAMMOGRAM  08/23/2021  . TETANUS/TDAP  01/27/2028  . DEXA SCAN  Completed  . PNA vac Low Risk Adult  Completed    Health Maintenance  Health Maintenance Due  Topic Date Due  . Hepatitis C Screening  Never done  . INFLUENZA VACCINE  08/04/2019  . COVID-19 Vaccine (3 - Booster for Pfizer series) 09/04/2019    Colorectal cancer screening: Type of screening: Colonoscopy. Completed 01/03/2018. Repeat every 3 years  Mammogram status: Completed 08/24/2019. Repeat every year  Bone Density status: Completed 01/04/2016. Results reflect: Bone density results: OSTEOPENIA. Repeat every 5 years.  Lung Cancer Screening: (Low Dose CT Chest  recommended if Age 3-80 years, 30 pack-year currently smoking OR have quit w/in 15years.) does not qualify.   Lung Cancer Screening Referral: N/A   Additional Screening:  Hepatitis C Screening: does qualify;   Vision Screening: Recommended annual ophthalmology exams for early detection of glaucoma and other disorders of the eye. Is the patient up to date with their annual eye exam?  {YES/NO:21197} Who is the provider or what is the name of the office in which the patient attends annual eye exams? *** If pt is not established with a provider, would they like to be referred to a provider to establish care? {YES/NO:21197}.   Dental Screening: Recommended annual dental exams for proper oral hygiene  Community Resource Referral / Chronic Care Management: CRR required this visit?  No   CCM required this visit?  No      Plan:     I have personally reviewed and noted the following in the patient's chart:   . Medical and social history . Use of alcohol, tobacco or illicit drugs  . Current medications and supplements . Functional ability and status . Nutritional status . Physical activity . Advanced directives . List of other physicians . Hospitalizations, surgeries, and ER visits in previous 12 months . Vitals . Screenings to include cognitive, depression, and falls . Referrals and appointments  In addition, I have reviewed and discussed with patient certain preventive protocols, quality metrics, and best practice recommendations. A written personalized care plan for preventive services as well as general preventive health recommendations were provided to patient.     Ofilia Neas, LPN   [id number]   Nurse Notes: None

## 2019-12-31 ENCOUNTER — Ambulatory Visit: Payer: Medicare PPO | Admitting: Physical Therapy

## 2020-01-02 ENCOUNTER — Other Ambulatory Visit: Payer: Self-pay

## 2020-01-02 ENCOUNTER — Ambulatory Visit: Payer: Medicare PPO | Admitting: Physical Therapy

## 2020-01-02 DIAGNOSIS — M5442 Lumbago with sciatica, left side: Secondary | ICD-10-CM | POA: Diagnosis not present

## 2020-01-02 DIAGNOSIS — M62830 Muscle spasm of back: Secondary | ICD-10-CM | POA: Diagnosis not present

## 2020-01-02 DIAGNOSIS — M6281 Muscle weakness (generalized): Secondary | ICD-10-CM

## 2020-01-02 DIAGNOSIS — M5441 Lumbago with sciatica, right side: Secondary | ICD-10-CM | POA: Diagnosis not present

## 2020-01-02 NOTE — Therapy (Signed)
[place]. [place], [place], [address] Phone: [phone]   Fax:  [phone]  Physical Therapy Treatment  Patient Details  Name: Sheila Ramsey MRN: [id number] Date of Birth: 1950-09-07 Referring Provider (PT): Sheila Ramsey   Encounter Date: 01/02/2020   PT End of Session - 01/02/20 1126    Visit Number 5    Number of Visits 12    Date for PT Re-Evaluation 02/03/20    PT Start Time 1055    PT Stop Time 1125    PT Time Calculation (min) 30 min           Past Medical History:  Diagnosis Date  . Arthritis    "hips, knees" (12/10/2013)  . Cataract   . Diverticular disease   . Heart murmur   . High cholesterol   . Hypertension   . Kidney stones   . Macular degeneration   . OSA on CPAP   . PONV (postoperative nausea and vomiting)   . Sleep apnea     Past Surgical History:  Procedure Laterality Date  . EXTRACORPOREAL SHOCK WAVE LITHOTRIPSY Left    had stent placed and then removed prior to lith  . EYE SURGERY    . JOINT REPLACEMENT    . KNEE ARTHROSCOPY Left 2014  . TONSILLECTOMY    . TOTAL HIP ARTHROPLASTY Right 09/27/2013   Procedure: RIGHT TOTAL HIP ARTHROPLASTY ANTERIOR APPROACH;  Surgeon: Sheila Alf, MD;  Location: [place];  Service: Orthopedics;  Laterality: Right;  . TOTAL HIP ARTHROPLASTY Left 12/10/2013   Procedure: LEFT TOTAL HIP ARTHROPLASTY ANTERIOR APPROACH;  Surgeon: Sheila Alf, MD;  Location: [place];  Service: Orthopedics;  Laterality: Left;    There were no vitals filed for this visit.   Subjective Assessment - 01/02/20 1100    Subjective " no pain " " i feel like i am wasting your time- since I feel good" RT hip did pop 2 x last night with KTC stretching    Currently in Pain? No/denies                             [facility] Adult PT Treatment/Exercise - 01/02/20 0001      Lumbar Exercises: Aerobic   Nustep L 3 6 min      Lumbar Exercises: Standing   Row  Strengthening;Both;20 reps;Theraband    Theraband Level (Row) Level 3 (Green)    Shoulder Extension Strengthening;Both;20 reps;Theraband    Theraband Level (Shoulder Extension) Level 3 (Green)    Other Standing Lumbar Exercises shld abd and ER green tband 2 sets 10    Other Standing Lumbar Exercises hip ext and abd red tband 2 sets 10      Lumbar Exercises: Seated   Other Seated Lumbar Exercises black tband trunk flex and ext 2 sets 10    Other Seated Lumbar Exercises iso abs with ball squeeze 15 x   tband hip abd and flex 15 each                   PT Short Term Goals - 12/25/19 0918      PT SHORT TERM GOAL #1   Title independent with HEP    Status Achieved             PT Long Term Goals - 01/02/20 1126      PT LONG TERM GOAL #1   Title understand posture  and body mechanics    Status Achieved      PT LONG TERM GOAL #2   Title decrease pain 50%    Status Partially Met      PT LONG TERM GOAL #3   Title start gym exercises safely    Baseline start 1st of year    Status On-going      PT LONG TERM GOAL #4   Title report minimal difficulty with using the vacuum    Status On-going                 Plan - 01/02/20 1126    Clinical Impression Statement pt arrived feeling good and worrying about wasting our time, explained to pt need to work on core stab and progress bakc to gym. tolerated 25-28 min of ex without pain but weakness noted with some ex. asked pt to keep appt next week and if no issues can call and request a hold as she transitions back into gym. progressing with goals    PT Treatment/Interventions ADLs/Self Care Home Management;Electrical Stimulation;Traction;Therapeutic activities;Therapeutic exercise;Neuromuscular re-education;Manual techniques;Patient/family education    PT Next Visit Plan assess response to core ex and possible HOLD as she transitions back to gym           Patient will benefit from skilled therapeutic intervention in order  to improve the following deficits and impairments:  Decreased range of motion,Difficulty walking,Increased muscle spasms,Pain,Improper body mechanics,Impaired flexibility,Postural dysfunction,Decreased strength  Visit Diagnosis: Muscle weakness (generalized)     Problem List Patient Active Problem List   Diagnosis Date Noted  . Encounter for Medicare annual wellness exam 12/06/2018  . Hypertension 05/04/2018  . Hyperlipidemia 05/04/2018  . OA (osteoarthritis) of hip 09/27/2013  . DIVERTICULOSIS-COLON 08/10/2009  . PERSONAL HX COLONIC POLYPS 08/10/2009    Sheila Ramsey,Sheila Ramsey PTA 01/02/2020, 11:29 AM  [place]. [place], [place], [address] Phone: [phone]   Fax:  [phone]  Name: Sheila Ramsey MRN: [id number] Date of Birth: August 20, 1950

## 2020-01-06 ENCOUNTER — Ambulatory Visit: Payer: Medicare PPO

## 2020-01-07 ENCOUNTER — Encounter: Payer: Self-pay | Admitting: Physical Therapy

## 2020-01-07 ENCOUNTER — Other Ambulatory Visit: Payer: Self-pay

## 2020-01-07 ENCOUNTER — Ambulatory Visit: Payer: Medicare PPO | Attending: Physical Medicine and Rehabilitation | Admitting: Physical Therapy

## 2020-01-07 DIAGNOSIS — M5441 Lumbago with sciatica, right side: Secondary | ICD-10-CM | POA: Diagnosis not present

## 2020-01-07 DIAGNOSIS — M62830 Muscle spasm of back: Secondary | ICD-10-CM | POA: Insufficient documentation

## 2020-01-07 DIAGNOSIS — M5442 Lumbago with sciatica, left side: Secondary | ICD-10-CM | POA: Insufficient documentation

## 2020-01-07 DIAGNOSIS — M6281 Muscle weakness (generalized): Secondary | ICD-10-CM | POA: Diagnosis not present

## 2020-01-07 NOTE — Therapy (Signed)
[place]. [place], [place], [address] Phone: [phone]   Fax:  [phone]  Physical Therapy Treatment  Patient Details  Name: Sheila Ramsey MRN: [id number] Date of Birth: Feb 11, 1950 Referring Provider (PT): Nelva Bush   Encounter Date: 01/07/2020   PT End of Session - 01/07/20 1136    Visit Number 6    Number of Visits 12    Date for PT Re-Evaluation 02/03/20    Authorization Type [employer]    PT Start Time 1055    PT Stop Time 1143    PT Time Calculation (min) 48 min    Activity Tolerance Patient tolerated treatment well    Behavior During Therapy [facility] for tasks assessed/performed           Past Medical History:  Diagnosis Date  . Arthritis    "hips, knees" (12/10/2013)  . Cataract   . Diverticular disease   . Heart murmur   . High cholesterol   . Hypertension   . Kidney stones   . Macular degeneration   . OSA on CPAP   . PONV (postoperative nausea and vomiting)   . Sleep apnea     Past Surgical History:  Procedure Laterality Date  . EXTRACORPOREAL SHOCK WAVE LITHOTRIPSY Left    had stent placed and then removed prior to lith  . EYE SURGERY    . JOINT REPLACEMENT    . KNEE ARTHROSCOPY Left 2014  . TONSILLECTOMY    . TOTAL HIP ARTHROPLASTY Right 09/27/2013   Procedure: RIGHT TOTAL HIP ARTHROPLASTY ANTERIOR APPROACH;  Surgeon: Gearlean Alf, MD;  Location: [place];  Service: Orthopedics;  Laterality: Right;  . TOTAL HIP ARTHROPLASTY Left 12/10/2013   Procedure: LEFT TOTAL HIP ARTHROPLASTY ANTERIOR APPROACH;  Surgeon: Gearlean Alf, MD;  Location: [place];  Service: Orthopedics;  Laterality: Left;    There were no vitals filed for this visit.   Subjective Assessment - 01/07/20 1058    Subjective Patient reports that she has been without power for two days, and has been bailing out water due to sump pump not working.  She reports that she was in a crawl spasce and is surprised that she is not really  hurting but is hurting more    Currently in Pain? Yes    Pain Score 4     Pain Location Back    Pain Orientation Lower    Pain Descriptors / Indicators Aching    Aggravating Factors  stress                             [facility] Adult PT Treatment/Exercise - 01/07/20 0001      Lumbar Exercises: Stretches   Piriformis Stretch Right;Left;3 reps;20 seconds    Other Lumbar Stretch Exercise seated side stretch with PT holding down pelvis and leaning over to elbow, then rotation with PT over pressure in sitting and supine      Lumbar Exercises: Aerobic   Nustep L 3 6 min      Lumbar Exercises: Standing   Row Strengthening;Both;20 reps;Theraband    Theraband Level (Row) Level 3 (Green)    Shoulder Extension Strengthening;Both;20 reps;Theraband    Theraband Level (Shoulder Extension) Level 3 (Green)    Other Standing Lumbar Exercises Patient did not want to try the hip tband as she reports that it really increased her pain in the hips and low back last visit  Lumbar Exercises: Seated   Other Seated Lumbar Exercises iso abs with ball squeeze 15 x      Lumbar Exercises: Supine   Other Supine Lumbar Exercises feet on ball K2C, trunk rotation, isometric abs      Electrical Stimulation   Electrical Stimulation Location L/S area    Electrical Stimulation Action IFC    Electrical Stimulation Parameters supine    Electrical Stimulation Goals Pain                    PT Short Term Goals - 12/25/19 0918      PT SHORT TERM GOAL #1   Title independent with HEP    Status Achieved             PT Long Term Goals - 01/07/20 1139      PT LONG TERM GOAL #1   Title understand posture and body mechanics    Status Achieved      PT LONG TERM GOAL #2   Title decrease pain 50%    Status Partially Met      PT LONG TERM GOAL #3   Title start gym exercises safely    Status Partially Met                 Plan - 01/07/20 1137    Clinical Impression  Statement Patient reports that she had increased pain after the last visit and thinks it was the tband hip abduction exercises, she alos reports that she has been under a lot of stress due to no haivng power for the past 36 hours and having to bail water from uner her house, so she is having some increased pain but reports that overall she is doing well in spite of this just sore and tender,  Started talking with her about balance and HEP for her home if we put her on hold this week    PT Next Visit Plan if doing well, assure HEP and posture and body mechanics and then could place on hold    Consulted and Agree with Plan of Care Patient           Patient will benefit from skilled therapeutic intervention in order to improve the following deficits and impairments:  Decreased range of motion,Difficulty walking,Increased muscle spasms,Pain,Improper body mechanics,Impaired flexibility,Postural dysfunction,Decreased strength  Visit Diagnosis: Muscle weakness (generalized)  Acute bilateral low back pain with bilateral sciatica  Muscle spasm of back     Problem List Patient Active Problem List   Diagnosis Date Noted  . Encounter for Medicare annual wellness exam 12/06/2018  . Hypertension 05/04/2018  . Hyperlipidemia 05/04/2018  . OA (osteoarthritis) of hip 09/27/2013  . DIVERTICULOSIS-COLON 08/10/2009  . PERSONAL HX COLONIC POLYPS 08/10/2009    Sumner Boast., PT 01/07/2020, 11:40 AM  [place]. [place], [place], [address] Phone: [phone]   Fax:  [phone]  Name: Sheila Ramsey MRN: [id number] Date of Birth: 09-21-1950

## 2020-01-10 ENCOUNTER — Encounter: Payer: Self-pay | Admitting: Physical Therapy

## 2020-01-10 ENCOUNTER — Ambulatory Visit: Payer: Medicare PPO | Admitting: Physical Therapy

## 2020-01-10 ENCOUNTER — Other Ambulatory Visit: Payer: Self-pay

## 2020-01-10 DIAGNOSIS — M6281 Muscle weakness (generalized): Secondary | ICD-10-CM

## 2020-01-10 DIAGNOSIS — M5441 Lumbago with sciatica, right side: Secondary | ICD-10-CM

## 2020-01-10 DIAGNOSIS — M62830 Muscle spasm of back: Secondary | ICD-10-CM

## 2020-01-10 DIAGNOSIS — M5442 Lumbago with sciatica, left side: Secondary | ICD-10-CM

## 2020-01-10 NOTE — Therapy (Signed)
[place]. [place], [place], [address] Phone: [phone]   Fax:  [phone]  Physical Therapy Treatment  Patient Details  Name: Sheila Ramsey MRN: [id number] Date of Birth: [birth year] Referring Provider (PT): Nelva Bush   Encounter Date: 01/10/2020   PT End of Session - 01/10/20 1055    Visit Number 7    Number of Visits 12    Date for PT Re-Evaluation 02/03/20    Authorization Type [employer]    PT Start Time 1008    PT Stop Time 1105    PT Time Calculation (min) 57 min    Activity Tolerance Patient tolerated treatment well    Behavior During Therapy [facility] for tasks assessed/performed           Past Medical History:  Diagnosis Date  . Arthritis    "hips, knees" (12/10/2013)  . Cataract   . Diverticular disease   . Heart murmur   . High cholesterol   . Hypertension   . Kidney stones   . Macular degeneration   . OSA on CPAP   . PONV (postoperative nausea and vomiting)   . Sleep apnea     Past Surgical History:  Procedure Laterality Date  . EXTRACORPOREAL SHOCK WAVE LITHOTRIPSY Left    had stent placed and then removed prior to lith  . EYE SURGERY    . JOINT REPLACEMENT    . KNEE ARTHROSCOPY Left 2014  . TONSILLECTOMY    . TOTAL HIP ARTHROPLASTY Right 09/27/2013   Procedure: RIGHT TOTAL HIP ARTHROPLASTY ANTERIOR APPROACH;  Surgeon: Gearlean Alf, MD;  Location: [facility];  Service: Orthopedics;  Laterality: Right;  . TOTAL HIP ARTHROPLASTY Left 12/10/2013   Procedure: LEFT TOTAL HIP ARTHROPLASTY ANTERIOR APPROACH;  Surgeon: Gearlean Alf, MD;  Location: [place] [facility];  Service: Orthopedics;  Laterality: Left;    There were no vitals filed for this visit.   Subjective Assessment - 01/10/20 1051    Subjective Patient reports that the activity and stress caught up with her, she is having a little more pain in the left buttock and the upper traps.  she reported that she might want to try dry needling    Currently  in Pain? Yes    Pain Score 5     Pain Location Hip    Pain Orientation Left                             [facility] Adult PT Treatment/Exercise - 01/10/20 0001      Electrical Stimulation   Electrical Stimulation Location left buttock    Electrical Stimulation Action IFC    Electrical Stimulation Parameters supine    Electrical Stimulation Goals Pain      Manual Therapy   Manual Therapy Soft tissue mobilization    Soft tissue mobilization to the lumbar and buttock area, focus on the right piriformis            Trigger Point Dry Needling - 01/10/20 0001    Consent Given? Yes    Education Handout Provided Yes    Muscles Treated Head and Neck Upper trapezius    Muscles Treated Back/Hip Piriformis;Gluteus maximus    Upper Trapezius Response Twitch reponse elicited    Gluteus Maximus Response Twitch response elicited    Piriformis Response Twitch response elicited                PT Education -  01/10/20 1054    Education Details used patients phone to video HEP with tband for UE, core and hip, also for balance, had her demo and gave verbal and tactile /visual cues    Person(s) Educated Patient    Methods Explanation;Demonstration;Tactile cues;Verbal cues;Handout    Comprehension Verbalized understanding;Returned demonstration;Verbal cues required            PT Short Term Goals - 12/25/19 0918      PT SHORT TERM GOAL #1   Title independent with HEP    Status Achieved             PT Long Term Goals - 01/10/20 1059      PT LONG TERM GOAL #1   Title understand posture and body mechanics    Status Achieved      PT LONG TERM GOAL #2   Title decrease pain 50%    Status Partially Met      PT LONG TERM GOAL #3   Title start gym exercises safely    Status Achieved      PT LONG TERM GOAL #4   Title report minimal difficulty with using the vacuum    Status Partially Met                 Plan - 01/10/20 1055    Clinical Impression  Statement Patient has had some issues with increased pain, she has been busy and has been very stressed, we went over and videoed HEP and she was able to perform.  We tried some DN today, she did have some anxiety with this.  She also reported not taking her blood pressure medication this morning.  She has tender areas in the hip especially left    PT Next Visit Plan may place on hold, she has insurance auth until the end of the month    Consulted and Agree with Plan of Care Patient           Patient will benefit from skilled therapeutic intervention in order to improve the following deficits and impairments:  Decreased range of motion,Difficulty walking,Increased muscle spasms,Pain,Improper body mechanics,Impaired flexibility,Postural dysfunction,Decreased strength  Visit Diagnosis: Muscle weakness (generalized)  Acute bilateral low back pain with bilateral sciatica  Muscle spasm of back     Problem List Patient Active Problem List   Diagnosis Date Noted  . Encounter for Medicare annual wellness exam 12/06/2018  . Hypertension 05/04/2018  . Hyperlipidemia 05/04/2018  . OA (osteoarthritis) of hip 09/27/2013  . DIVERTICULOSIS-COLON 08/10/2009  . PERSONAL HX COLONIC POLYPS 08/10/2009    Sumner Boast., PT 01/10/2020, 11:00 AM  [place]. [place], [place], [address] Phone: [phone]   Fax:  [phone]  Name: SHERNELL SALDIERNA MRN: [id number] Date of Birth: 1950/02/08

## 2020-01-10 NOTE — Patient Instructions (Signed)

## 2020-02-05 ENCOUNTER — Other Ambulatory Visit: Payer: Self-pay | Admitting: Family Medicine

## 2020-03-04 DIAGNOSIS — H2513 Age-related nuclear cataract, bilateral: Secondary | ICD-10-CM | POA: Diagnosis not present

## 2020-03-04 DIAGNOSIS — H353131 Nonexudative age-related macular degeneration, bilateral, early dry stage: Secondary | ICD-10-CM | POA: Diagnosis not present

## 2020-03-04 DIAGNOSIS — H25013 Cortical age-related cataract, bilateral: Secondary | ICD-10-CM | POA: Diagnosis not present

## 2020-03-04 DIAGNOSIS — H40003 Preglaucoma, unspecified, bilateral: Secondary | ICD-10-CM | POA: Diagnosis not present

## 2020-03-10 ENCOUNTER — Telehealth: Payer: Self-pay | Admitting: Family Medicine

## 2020-03-10 NOTE — Telephone Encounter (Signed)
Left message for patient to call back and schedule Medicare Annual Wellness Visit (AWV) either virtually or in office. No detailed message left    Last AWV 12/06/2018 please schedule at anytime with [facility] Nurse Health Advisor 1 or 2   This should be a 45 minute visit.

## 2020-03-21 ENCOUNTER — Other Ambulatory Visit: Payer: Self-pay | Admitting: Family Medicine

## 2020-03-21 DIAGNOSIS — I10 Essential (primary) hypertension: Secondary | ICD-10-CM

## 2020-03-24 ENCOUNTER — Other Ambulatory Visit: Payer: Self-pay | Admitting: Family Medicine

## 2020-03-25 DIAGNOSIS — R3129 Other microscopic hematuria: Secondary | ICD-10-CM | POA: Diagnosis not present

## 2020-03-25 DIAGNOSIS — N130 Hydronephrosis with ureteropelvic junction obstruction: Secondary | ICD-10-CM | POA: Diagnosis not present

## 2020-03-25 DIAGNOSIS — N201 Calculus of ureter: Secondary | ICD-10-CM | POA: Diagnosis not present

## 2020-03-25 DIAGNOSIS — K449 Diaphragmatic hernia without obstruction or gangrene: Secondary | ICD-10-CM | POA: Diagnosis not present

## 2020-03-25 DIAGNOSIS — N132 Hydronephrosis with renal and ureteral calculous obstruction: Secondary | ICD-10-CM | POA: Diagnosis not present

## 2020-03-25 DIAGNOSIS — K429 Umbilical hernia without obstruction or gangrene: Secondary | ICD-10-CM | POA: Diagnosis not present

## 2020-03-26 DIAGNOSIS — G4730 Sleep apnea, unspecified: Secondary | ICD-10-CM | POA: Diagnosis not present

## 2020-03-26 DIAGNOSIS — N201 Calculus of ureter: Secondary | ICD-10-CM | POA: Diagnosis not present

## 2020-03-26 DIAGNOSIS — R3129 Other microscopic hematuria: Secondary | ICD-10-CM | POA: Diagnosis not present

## 2020-03-26 DIAGNOSIS — R9431 Abnormal electrocardiogram [ECG] [EKG]: Secondary | ICD-10-CM | POA: Diagnosis not present

## 2020-04-02 DIAGNOSIS — N201 Calculus of ureter: Secondary | ICD-10-CM | POA: Diagnosis not present

## 2020-06-12 ENCOUNTER — Ambulatory Visit (INDEPENDENT_AMBULATORY_CARE_PROVIDER_SITE_OTHER): Payer: Medicare PPO

## 2020-06-12 DIAGNOSIS — Z0000 Encounter for general adult medical examination without abnormal findings: Secondary | ICD-10-CM | POA: Diagnosis not present

## 2020-06-12 NOTE — Progress Notes (Signed)
Subjective:   Sheila Ramsey is a 70 y.o. female who presents for an Initial Medicare Annual Wellness Visit.  Virtual Visit via Video Note  I connected with Sheila Ramsey by a video enabled telemedicine application and verified that I am speaking with the correct person using two identifiers.  Location: Patient: Home Provider: Office Persons participating in the virtual visit: patient, provider   I discussed the limitations of evaluation and management by telemedicine and the availability of in person appointments. The patient expressed understanding and agreed to proceed.     Mickel Baas Chantay Whitelock,LPN   Review of Systems    N/A       Objective:    There were no vitals filed for this visit. There is no height or weight on file to calculate BMI.  Advanced Directives 12/16/2019 12/06/2018 10/30/2018 01/30/2017 12/10/2013 12/02/2013 09/27/2013  Does Patient Have a Medical Advance Directive? No No Yes No No No Yes  Does patient want to make changes to medical advance directive? - - - - - - No - Patient declined  Would patient like information on creating a medical advance directive? No - Patient declined Yes (MAU/Ambulatory/Procedural Areas - Information given) - - No - patient declined information - -    Current Medications (verified) Outpatient Encounter Medications as of 06/12/2020  Medication Sig   amLODipine (NORVASC) 2.5 MG tablet TAKE 3 TABLETS(7.5 MG) BY MOUTH DAILY   Cholecalciferol (VITAMIN D3 PO) Take 5,000 Units by mouth.   Coenzyme Q10 (COQ10) 100 MG CAPS Take by mouth.   losartan (COZAAR) 50 MG tablet Take 1 tablet (50 mg total) by mouth daily.   MAGNESIUM PO Take by mouth.   Multiple Vitamins-Minerals (MULTIVITAMIN ADULTS PO) Take by mouth.   Multiple Vitamins-Minerals (PRESERVISION AREDS PO) Take by mouth.   OVER THE COUNTER MEDICATION Super Beets 8 oz powder po daily   OVER THE COUNTER MEDICATION Ultimate Eye Support po daily   rosuvastatin (CRESTOR) 20 MG tablet TAKE  1 TABLET(20 MG) BY MOUTH DAILY   zinc gluconate 50 MG tablet Take 50 mg by mouth daily.   Zolpidem Tartrate (AMBIEN PO) Take 10 mg by mouth. Take 1/4 tablet by mouth at night    No facility-administered encounter medications on file as of 06/12/2020.    Allergies (verified) Hydrocodone and Oxycodone   History: Past Medical History:  Diagnosis Date   Arthritis    "hips, knees" (12/10/2013)   Cataract    Diverticular disease    Heart murmur    High cholesterol    Hypertension    Kidney stones    Macular degeneration    OSA on CPAP    PONV (postoperative nausea and vomiting)    Sleep apnea    Past Surgical History:  Procedure Laterality Date   EXTRACORPOREAL SHOCK WAVE LITHOTRIPSY Left    had stent placed and then removed prior to [place] ARTHROSCOPY Left 2014   TONSILLECTOMY     TOTAL HIP ARTHROPLASTY Right 09/27/2013   Procedure: RIGHT TOTAL HIP ARTHROPLASTY ANTERIOR APPROACH;  Surgeon: Gearlean Alf, MD;  Location: [facility];  Service: Orthopedics;  Laterality: Right;   TOTAL HIP ARTHROPLASTY Left 12/10/2013   Procedure: LEFT TOTAL HIP ARTHROPLASTY ANTERIOR APPROACH;  Surgeon: Gearlean Alf, MD;  Location: [facility];  Service: Orthopedics;  Laterality: Left;   Family History  Problem Relation Age of Onset   Dementia Mother  NPH, Lewey Body Dementia   Hypertension Mother    HIV/AIDS Brother    Other Maternal Grandmother        nasal cancer, snuff   Stroke Maternal Grandfather    Early death Paternal Grandmother    Social History   Socioeconomic History   Marital status: Widowed    Spouse name: Not on file   Number of children: 1   Years of education: 4 years college   Highest education level: Bachelor's degree (e.g., BA, AB, BS)  Occupational History   Occupation: retired    Comment: Contractor  Tobacco Use   Smoking status: Former    Pack years: 0.00    Types: Cigarettes   Smokeless tobacco: Never    Tobacco comments:    "socially smoked; quit smoking in the 1990's"  Vaping Use   Vaping Use: Never used  Substance and Sexual Activity   Alcohol use: Yes    Comment: occ   Drug use: No   Sexual activity: Not on file  Other Topics Concern   Not on file  Social History Narrative   Husband Ronalee Belts passed away 2 years ago. She belongs to a grief group that has been tremendous support to her. They continue to meet weekly. She has a son who is very supportive and many friends. She also has 1 dog in her home.    Social Determinants of Health   Financial Resource Strain: Not on file  Food Insecurity: Not on file  Transportation Needs: Not on file  Physical Activity: Not on file  Stress: Not on file  Social Connections: Not on file    Tobacco Counseling Counseling given: Not Answered Tobacco comments: "socially smoked; quit smoking in the 1990's"   Clinical Intake:                 Diabetic?no         Activities of Daily Living No flowsheet data found.  Patient Care Team: Caren Macadam, MD as PCP - General (Family Medicine) Gastroenterology, [facility] (Gastroenterology) Rhoton, Shelda Altes., MD (Gastroenterology) Hinton Rao, MD (Obstetrics and Gynecology) Colonel Bald, MD (Internal Medicine) Amedeo Kinsman, OD (Optometry)  Indicate any recent Medical Services you may have received from other than [facility] providers in the past year (date may be approximate).     Assessment:   This is a routine wellness examination for Sheila Ramsey.  Hearing/Vision screen No results found.  Dietary issues and exercise activities discussed:     Goals Addressed   None    Depression Screen PHQ 2/9 Scores 12/06/2018 11/26/2018  PHQ - 2 Score 0 0    Fall Risk Fall Risk  12/06/2018 11/26/2018  Falls in the past year? 0 0  Number falls in past yr: - 0  Injury with Fall? - 0  Follow up Falls evaluation completed;Education provided;Falls prevention discussed -     FALL RISK PREVENTION PERTAINING TO THE HOME:  Any stairs in or around the home? Yes  If so, are there any without handrails? No  Home free of loose throw rugs in walkways, pet beds, electrical cords, etc? Yes  Adequate lighting in your home to reduce risk of falls? Yes   ASSISTIVE DEVICES UTILIZED TO PREVENT FALLS:  Life alert? No  Use of a cane, walker or w/c? No  Grab bars in the bathroom? No  Shower chair or bench in shower? No  Elevated toilet seat or a handicapped toilet? No      Cognitive  Function:    Normal cognitive status assessed by direct observation by this Nurse Health Advisor. No abnormalities found.      Immunizations Immunization History  Administered Date(s) Administered   Influenza, High Dose Seasonal PF 10/11/2017, 09/18/2018   Influenza-Unspecified 11/03/2013, 12/21/2016   PFIZER(Purple Top)SARS-COV-2 Vaccination 02/04/2019, 03/04/2019   Pneumococcal Polysaccharide-23 11/03/2017   Tdap 01/26/2018   Zoster Recombinat (Shingrix) 08/04/2016, 02/05/2017   Zoster, Live 11/05/2014    TDAP status: Up to date  Flu Vaccine status: Up to date  Pneumococcal vaccine status: Up to date  Covid-19 vaccine status: Completed vaccines  Qualifies for Shingles Vaccine? Yes   Zostavax completed No   Shingrix Completed?: Yes  Screening Tests Health Maintenance  Topic Date Due   Hepatitis C Screening  Never done   COVID-19 Vaccine (3 - Booster for Pfizer series) 08/04/2019   INFLUENZA VACCINE  08/03/2020   COLONOSCOPY (Pts 45-6yrs Insurance coverage will need to be confirmed)  01/03/2021   MAMMOGRAM  08/23/2021   TETANUS/TDAP  01/27/2028   DEXA SCAN  Completed   PNA vac Low Risk Adult  Completed   Zoster Vaccines- Shingrix  Completed   HPV VACCINES  Aged Out    Health Maintenance  Health Maintenance Due  Topic Date Due   Hepatitis C Screening  Never done   COVID-19 Vaccine (3 - Booster for [place] series) 08/04/2019    Colorectal cancer  screening: Type of screening: Colonoscopy. Completed 01/03/2018. Repeat every 3 years  Mammogram status: Completed 08/24/2019. Repeat every year  Bone Density status: Completed 01/04/2016. Results reflect: Bone density results: NORMAL. Repeat every 5 years.  Lung Cancer Screening: (Low Dose CT Chest recommended if Age 71-80 years, 30 pack-year currently smoking OR have quit w/in 15years.) does not qualify.   Lung Cancer Screening Referral: n/a  Additional Screening:  Hepatitis C Screening: does qualify  Vision Screening: Recommended annual ophthalmology exams for early detection of glaucoma and other disorders of the eye. Is the patient up to date with their annual eye exam?  Yes  Who is the provider or what is the name of the office in which the patient attends annual eye exams? Dr.Prez If pt is not established with a provider, would they like to be referred to a provider to establish care? No .   Dental Screening: Recommended annual dental exams for proper oral hygiene  Community Resource Referral / Chronic Care Management: CRR required this visit?  No   CCM required this visit?  No      Plan:     I have personally reviewed and noted the following in the patient's chart:   Medical and social history Use of alcohol, tobacco or illicit drugs  Current medications and supplements including opioid prescriptions. Patient is not currently taking opioid prescriptions. Functional ability and status Nutritional status Physical activity Advanced directives List of other physicians Hospitalizations, surgeries, and ER visits in previous 12 months Vitals Screenings to include cognitive, depression, and falls Referrals and appointments  In addition, I have reviewed and discussed with patient certain preventive protocols, quality metrics, and best practice recommendations. A written personalized care plan for preventive services as well as general preventive health recommendations were  provided to patient.     Randel Pigg, LPN   1/61/0960   Nurse Notes: none

## 2020-06-12 NOTE — Patient Instructions (Signed)
[name] , Thank you for taking time to come for your Medicare Wellness Visit. I appreciate your ongoing commitment to your health goals. Please review the following plan we discussed and let me know if I can assist you in the future.   Screening recommendations/referrals: Colonoscopy: 01/03/2018  due 2023 Mammogram: due 08/24/2019 Bone Density: 01/04/2016 due 2023 Recommended yearly ophthalmology/optometry visit for glaucoma screening and checkup Recommended yearly dental visit for hygiene and checkup  Vaccinations: Influenza vaccine: due in fall 2022 Pneumococcal vaccine: completed series  Tdap vaccine: current due 2030 Shingles vaccine: completed series  Advanced directives: will provide copies  Conditions/risks identified: none   Next appointment: 09/14/2020  @1130am   Dr.Koberlein    Preventive Care 18 Years and Older, Female Preventive care refers to lifestyle choices and visits with your health care provider that can promote health and wellness. What does preventive care include? A yearly physical exam. This is also called an annual well check. Dental exams once or twice a year. Routine eye exams. Ask your health care provider how often you should have your eyes checked. Personal lifestyle choices, including: Daily care of your teeth and gums. Regular physical activity. Eating a healthy diet. Avoiding tobacco and drug use. Limiting alcohol use. Practicing safe sex. Taking low-dose aspirin every day. Taking vitamin and mineral supplements as recommended by your health care provider. What happens during an annual well check? The services and screenings done by your health care provider during your annual well check will depend on your age, overall health, lifestyle risk factors, and family history of disease. Counseling  Your health care provider may ask you questions about your: Alcohol use. Tobacco use. Drug use. Emotional well-being. Home and relationship  well-being. Sexual activity. Eating habits. History of falls. Memory and ability to understand (cognition). Work and work Statistician. Reproductive health. Screening  You may have the following tests or measurements: Height, weight, and BMI. Blood pressure. Lipid and cholesterol levels. These may be checked every 5 years, or more frequently if you are over 2 years old. Skin check. Lung cancer screening. You may have this screening every year starting at age 73 if you have a 30-pack-year history of smoking and currently smoke or have quit within the past 15 years. Fecal occult blood test (FOBT) of the stool. You may have this test every year starting at age 54. Flexible sigmoidoscopy or colonoscopy. You may have a sigmoidoscopy every 5 years or a colonoscopy every 10 years starting at age 70. Hepatitis C blood test. Hepatitis B blood test. Sexually transmitted disease (STD) testing. Diabetes screening. This is done by checking your blood sugar (glucose) after you have not eaten for a while (fasting). You may have this done every 1-3 years. Bone density scan. This is done to screen for osteoporosis. You may have this done starting at age 64. Mammogram. This may be done every 1-2 years. Talk to your health care provider about how often you should have regular mammograms. Talk with your health care provider about your test results, treatment options, and if necessary, the need for more tests. Vaccines  Your health care provider may recommend certain vaccines, such as: Influenza vaccine. This is recommended every year. Tetanus, diphtheria, and acellular pertussis (Tdap, Td) vaccine. You may need a Td booster every 10 years. Zoster vaccine. You may need this after age 31. Pneumococcal 13-valent conjugate (PCV13) vaccine. One dose is recommended after age 85. Pneumococcal polysaccharide (PPSV23) vaccine. One dose is recommended after age 61. Talk to your  health care provider about which  screenings and vaccines you need and how often you need them. This information is not intended to replace advice given to you by your health care provider. Make sure you discuss any questions you have with your health care provider. Document Released: 01/16/2015 Document Revised: 09/09/2015 Document Reviewed: 10/21/2014 Elsevier Interactive Patient Education  2017 [employer] Prevention in the Home Falls can cause injuries. They can happen to people of all ages. There are many things you can do to make your home safe and to help prevent falls. What can I do on the outside of my home? Regularly fix the edges of walkways and driveways and fix any cracks. Remove anything that might make you trip as you walk through a door, such as a raised step or threshold. Trim any bushes or trees on the path to your home. Use bright outdoor lighting. Clear any walking paths of anything that might make someone trip, such as rocks or tools. Regularly check to see if handrails are loose or broken. Make sure that both sides of any steps have handrails. Any raised decks and porches should have guardrails on the edges. Have any leaves, snow, or ice cleared regularly. Use sand or salt on walking paths during winter. Clean up any spills in your garage right away. This includes oil or grease spills. What can I do in the bathroom? Use night lights. Install grab bars by the toilet and in the tub and shower. Do not use towel bars as grab bars. Use non-skid mats or decals in the tub or shower. If you need to sit down in the shower, use a plastic, non-slip stool. Keep the floor dry. Clean up any water that spills on the floor as soon as it happens. Remove soap buildup in the tub or shower regularly. Attach bath mats securely with double-sided non-slip rug tape. Do not have throw rugs and other things on the floor that can make you trip. What can I do in the bedroom? Use night lights. Make sure that you have a  light by your bed that is easy to reach. Do not use any sheets or blankets that are too big for your bed. They should not hang down onto the floor. Have a firm chair that has side arms. You can use this for support while you get dressed. Do not have throw rugs and other things on the floor that can make you trip. What can I do in the kitchen? Clean up any spills right away. Avoid walking on wet floors. Keep items that you use a lot in easy-to-reach places. If you need to reach something above you, use a strong step stool that has a grab bar. Keep electrical cords out of the way. Do not use floor polish or wax that makes floors slippery. If you must use wax, use non-skid floor wax. Do not have throw rugs and other things on the floor that can make you trip. What can I do with my stairs? Do not leave any items on the stairs. Make sure that there are handrails on both sides of the stairs and use them. Fix handrails that are broken or loose. Make sure that handrails are as long as the stairways. Check any carpeting to make sure that it is firmly attached to the stairs. Fix any carpet that is loose or worn. Avoid having throw rugs at the top or bottom of the stairs. If you do have throw rugs, attach them  to the floor with carpet tape. Make sure that you have a light switch at the top of the stairs and the bottom of the stairs. If you do not have them, ask someone to add them for you. What else can I do to help prevent falls? Wear shoes that: Do not have high heels. Have rubber bottoms. Are comfortable and fit you well. Are closed at the toe. Do not wear sandals. If you use a stepladder: Make sure that it is fully opened. Do not climb a closed stepladder. Make sure that both sides of the stepladder are locked into place. Ask someone to hold it for you, if possible. Clearly mark and make sure that you can see: Any grab bars or handrails. First and last steps. Where the edge of each step  is. Use tools that help you move around (mobility aids) if they are needed. These include: Canes. Walkers. Scooters. Crutches. Turn on the lights when you go into a dark area. Replace any light bulbs as soon as they burn out. Set up your furniture so you have a clear path. Avoid moving your furniture around. If any of your floors are uneven, fix them. If there are any pets around you, be aware of where they are. Review your medicines with your doctor. Some medicines can make you feel dizzy. This can increase your chance of falling. Ask your doctor what other things that you can do to help prevent falls. This information is not intended to replace advice given to you by your health care provider. Make sure you discuss any questions you have with your health care provider. Document Released: 10/16/2008 Document Revised: 05/28/2015 Document Reviewed: 01/24/2014 Elsevier Interactive Patient Education  2017 [employer].

## 2020-06-28 ENCOUNTER — Other Ambulatory Visit: Payer: Self-pay | Admitting: Family Medicine

## 2020-06-29 ENCOUNTER — Other Ambulatory Visit: Payer: Self-pay | Admitting: Family Medicine

## 2020-07-09 DIAGNOSIS — H2513 Age-related nuclear cataract, bilateral: Secondary | ICD-10-CM | POA: Diagnosis not present

## 2020-07-09 DIAGNOSIS — H25013 Cortical age-related cataract, bilateral: Secondary | ICD-10-CM | POA: Diagnosis not present

## 2020-07-09 DIAGNOSIS — H353131 Nonexudative age-related macular degeneration, bilateral, early dry stage: Secondary | ICD-10-CM | POA: Diagnosis not present

## 2020-08-28 DIAGNOSIS — Z1231 Encounter for screening mammogram for malignant neoplasm of breast: Secondary | ICD-10-CM | POA: Diagnosis not present

## 2020-08-28 LAB — HM MAMMOGRAPHY

## 2020-09-14 ENCOUNTER — Encounter: Payer: Medicare PPO | Admitting: Family Medicine

## 2020-09-22 ENCOUNTER — Other Ambulatory Visit: Payer: Self-pay | Admitting: Family Medicine

## 2020-10-02 ENCOUNTER — Encounter: Payer: Medicare PPO | Admitting: Family Medicine

## 2020-10-12 DIAGNOSIS — Z111 Encounter for screening for respiratory tuberculosis: Secondary | ICD-10-CM | POA: Diagnosis not present

## 2020-10-14 DIAGNOSIS — Z111 Encounter for screening for respiratory tuberculosis: Secondary | ICD-10-CM | POA: Diagnosis not present

## 2020-11-04 ENCOUNTER — Encounter: Payer: Self-pay | Admitting: Family Medicine

## 2020-11-09 ENCOUNTER — Ambulatory Visit: Payer: Medicare PPO | Admitting: Family Medicine

## 2020-11-10 DIAGNOSIS — Z87442 Personal history of urinary calculi: Secondary | ICD-10-CM | POA: Diagnosis not present

## 2020-11-10 DIAGNOSIS — N200 Calculus of kidney: Secondary | ICD-10-CM | POA: Diagnosis not present

## 2020-11-30 ENCOUNTER — Ambulatory Visit: Payer: Medicare PPO | Admitting: Family Medicine

## 2020-12-01 ENCOUNTER — Telehealth: Payer: Self-pay

## 2020-12-01 NOTE — Telephone Encounter (Signed)
Patient would like a call when January schedule is available patient wants a afternoon appt around the 15th

## 2020-12-02 NOTE — Telephone Encounter (Signed)
Spoke with the patient and she requested an appt for a physical. Informed the patient it looks like she had an Annual Wellness Visit with the health coach in June and another physical is not needed until 1 year-1 day later.  Follow up appt scheduled for 01/15/2021 per patients preference as the last visit with the PCP was over 1 year ago on 10/02/2019.

## 2020-12-04 ENCOUNTER — Ambulatory Visit: Payer: Medicare PPO | Admitting: Family Medicine

## 2020-12-20 ENCOUNTER — Other Ambulatory Visit: Payer: Self-pay | Admitting: Family Medicine

## 2021-01-15 ENCOUNTER — Encounter: Payer: Self-pay | Admitting: Family Medicine

## 2021-01-15 ENCOUNTER — Ambulatory Visit: Payer: Medicare PPO | Admitting: Family Medicine

## 2021-01-15 ENCOUNTER — Telehealth: Payer: Self-pay | Admitting: Family Medicine

## 2021-01-15 ENCOUNTER — Other Ambulatory Visit (HOSPITAL_COMMUNITY)
Admission: RE | Admit: 2021-01-15 | Discharge: 2021-01-15 | Disposition: A | Discharge status: Home / Self Care | Payer: Medicare PPO | Source: Ambulatory Visit | Attending: Family Medicine | Admitting: Family Medicine

## 2021-01-15 VITALS — BP 160/60 | HR 115 | Temp 98.4°F | Ht 63.0 in | Wt 171.2 lb

## 2021-01-15 DIAGNOSIS — Z87442 Personal history of urinary calculi: Secondary | ICD-10-CM | POA: Diagnosis not present

## 2021-01-15 DIAGNOSIS — Z1151 Encounter for screening for human papillomavirus (HPV): Secondary | ICD-10-CM | POA: Insufficient documentation

## 2021-01-15 DIAGNOSIS — Z860100 Personal history of colon polyps, unspecified: Secondary | ICD-10-CM

## 2021-01-15 DIAGNOSIS — Z23 Encounter for immunization: Secondary | ICD-10-CM

## 2021-01-15 DIAGNOSIS — R7301 Impaired fasting glucose: Secondary | ICD-10-CM | POA: Diagnosis not present

## 2021-01-15 DIAGNOSIS — R350 Frequency of micturition: Secondary | ICD-10-CM | POA: Diagnosis not present

## 2021-01-15 DIAGNOSIS — E2839 Other primary ovarian failure: Secondary | ICD-10-CM

## 2021-01-15 DIAGNOSIS — I10 Essential (primary) hypertension: Secondary | ICD-10-CM

## 2021-01-15 DIAGNOSIS — Z124 Encounter for screening for malignant neoplasm of cervix: Secondary | ICD-10-CM | POA: Insufficient documentation

## 2021-01-15 DIAGNOSIS — E785 Hyperlipidemia, unspecified: Secondary | ICD-10-CM | POA: Diagnosis not present

## 2021-01-15 DIAGNOSIS — D229 Melanocytic nevi, unspecified: Secondary | ICD-10-CM

## 2021-01-15 NOTE — Addendum Note (Signed)
Addended by: Agnes Lawrence on: 01/15/2021 04:29 PM   Modules accepted: Orders

## 2021-01-15 NOTE — Addendum Note (Signed)
Addended by: Agnes Lawrence on: 01/15/2021 04:31 PM   Modules accepted: Orders

## 2021-01-15 NOTE — Patient Instructions (Signed)
Ask your eye doctor if you can use something for eustachian tube dysfunction: flonase tends to be the best to open up the ears; if not then trial daily antihistamine (like zyrtec) for a month to see if this helps to keep ears more open. Check with eye doc as meds like flonase can increase eye pressure.

## 2021-01-15 NOTE — Progress Notes (Signed)
Sheila Ramsey DOB: Aug 07, 1950 Encounter date: 01/15/2021  This is a 71 y.o. female who presents with Chief Complaint  Patient presents with   Follow-up    History of present illness: Last visit with me was 09/2019.  Hypertension: Amlodipine 10 mg daily Hyperlipidemia: Crestor 20 mg daily Insomnia:  Colonoscopy: she gets this every 3 years. [facility]. She has it set up for May this year.   Mammogram 08/2020 normal.  States due for pap; gyn retired.   Eye exam every 6 months due to cataracts, mac degen, glaucoma.   Had kidney stone last month, but doesn't go back for another year to specialist.   Allergies  Allergen Reactions   Hydrocodone Nausea Only   Oxycodone Nausea Only   Current Meds  Medication Sig   amLODipine (NORVASC) 10 MG tablet Take by mouth.   Cholecalciferol (VITAMIN D3 PO) Take 5,000 Units by mouth.   Coenzyme Q10 (COQ10) 100 MG CAPS Take by mouth.   losartan (COZAAR) 100 MG tablet losartan 100 mg tablet   MAGNESIUM PO Take by mouth.   Multiple Vitamins-Minerals (MULTIVITAMIN ADULTS PO) Take by mouth.   Multiple Vitamins-Minerals (PRESERVISION AREDS PO) Take by mouth.   rosuvastatin (CRESTOR) 20 MG tablet TAKE 1 TABLET(20 MG) BY MOUTH DAILY   Zolpidem Tartrate (AMBIEN PO) Take 10 mg by mouth. Take 1/4 tablet by mouth at night     Review of Systems  Objective:  BP (!) 160/60 (BP Location: Right Arm, Cuff Size: Large)    Pulse (!) 115    Temp 98.4 F (36.9 C) (Oral)    Ht _0  (1.6 m)    Wt 171 lb 3.2 oz (77.7 kg)    SpO2 95%    BMI 30.33 kg/m   Weight: 171 lb 3.2 oz (77.7 kg)   BP Readings from Last 3 Encounters:  01/15/21 (!) 160/60  10/02/19 (!) 184/80  08/16/19 (!) 180/69   Wt Readings from Last 3 Encounters:  01/15/21 171 lb 3.2 oz (77.7 kg)  10/02/19 167 lb 9.6 oz (76 kg)  08/16/19 165 lb (74.8 kg)    Physical Exam Constitutional:      General: She is not in acute distress.    Appearance: She is well-developed.  HENT:      Head: Normocephalic and atraumatic.     Right Ear: External ear normal.     Left Ear: External ear normal.     Mouth/Throat:     Pharynx: No oropharyngeal exudate.  Eyes:     Conjunctiva/sclera: Conjunctivae normal.     Pupils: Pupils are equal, round, and reactive to light.  Neck:     Thyroid: No thyromegaly.  Cardiovascular:     Rate and Rhythm: Normal rate and regular rhythm.     Heart sounds: Murmur heard.  Systolic murmur is present with a grade of 1/6.    No friction rub. No gallop.  Pulmonary:     Effort: Pulmonary effort is normal.     Breath sounds: Normal breath sounds.  Abdominal:     General: Bowel sounds are normal. There is no distension.     Palpations: Abdomen is soft. There is no mass.     Tenderness: There is no abdominal tenderness. There is no guarding.     Hernia: No hernia is present.  Genitourinary:    General: Normal vulva.     Exam position: Supine.     Labia:        Right: No rash.  Left: No rash.      Vagina: Normal.     Adnexa: Right adnexa normal and left adnexa normal.     Comments: Cervical os polyp noted.  Musculoskeletal:        General: No tenderness or deformity. Normal range of motion.     Cervical back: Normal range of motion and neck supple.  Lymphadenopathy:     Cervical: No cervical adenopathy.  Skin:    General: Skin is warm and dry.     Findings: No rash.  Neurological:     Mental Status: She is alert and oriented to person, place, and time.     Deep Tendon Reflexes: Reflexes normal.     Reflex Scores:      Tricep reflexes are 2+ on the right side and 2+ on the left side.      Bicep reflexes are 2+ on the right side and 2+ on the left side.      Brachioradialis reflexes are 2+ on the right side and 2+ on the left side.      Patellar reflexes are 2+ on the right side and 2+ on the left side. Psychiatric:        Speech: Speech normal.        Behavior: Behavior normal.        Thought Content: Thought content normal.     Assessment/Plan  1. Primary hypertension Follows with cardiology and is regulating at home. Home numbers better than in house numbers.   2. Hyperlipidemia, unspecified hyperlipidemia type Continue with crestor; recheck labs today.  3. Impaired fasting glucose Diet controlled.   4. History of kidney stones She is following with specialist.   5. PERSONAL HX COLONIC POLYPS Colonoscopy q 3 years. She is set up for may for next procedure.   6. Numerous skin moles Referral to derm. Patient requested.   Return for pending lab results.       Micheline Rough, MD

## 2021-01-15 NOTE — Telephone Encounter (Signed)
Pt was seen today and forgot to tell dr Ethlyn Gallery she is going back to school and went to [facility] and she tested neg for polio

## 2021-01-16 LAB — CBC WITH DIFFERENTIAL/PLATELET
Absolute Monocytes: 432 cells/uL (ref 200–950)
Basophils Absolute: 59 cells/uL (ref 0–200)
Basophils Relative: 1.3 %
Eosinophils Absolute: 41 cells/uL (ref 15–500)
Eosinophils Relative: 0.9 %
HCT: 45.5 % — ABNORMAL HIGH (ref 35.0–45.0)
Hemoglobin: 15.4 g/dL (ref 11.7–15.5)
Lymphs Abs: 1409 cells/uL (ref 850–3900)
MCH: 32.1 pg (ref 27.0–33.0)
MCHC: 33.8 g/dL (ref 32.0–36.0)
MCV: 94.8 fL (ref 80.0–100.0)
MPV: 9.6 fL (ref 7.5–12.5)
Monocytes Relative: 9.6 %
Neutro Abs: 2561 cells/uL (ref 1500–7800)
Neutrophils Relative %: 56.9 %
Platelets: 323 10*3/uL (ref 140–400)
RBC: 4.8 10*6/uL (ref 3.80–5.10)
RDW: 11.7 % (ref 11.0–15.0)
Total Lymphocyte: 31.3 %
WBC: 4.5 10*3/uL (ref 3.8–10.8)

## 2021-01-16 LAB — COMPREHENSIVE METABOLIC PANEL
AG Ratio: 1.6 (calc) (ref 1.0–2.5)
ALT: 17 U/L (ref 6–29)
AST: 17 U/L (ref 10–35)
Albumin: 4.6 g/dL (ref 3.6–5.1)
Alkaline phosphatase (APISO): 55 U/L (ref 37–153)
BUN: 16 mg/dL (ref 7–25)
CO2: 30 mmol/L (ref 20–32)
Calcium: 10 mg/dL (ref 8.6–10.4)
Chloride: 103 mmol/L (ref 98–110)
Creat: 0.84 mg/dL (ref 0.50–1.05)
Globulin: 2.8 g/dL (calc) (ref 1.9–3.7)
Glucose, Bld: 129 mg/dL — ABNORMAL HIGH (ref 65–99)
Potassium: 4.1 mmol/L (ref 3.5–5.3)
Sodium: 141 mmol/L (ref 135–146)
Total Bilirubin: 0.6 mg/dL (ref 0.2–1.2)
Total Protein: 7.4 g/dL (ref 6.1–8.1)

## 2021-01-16 LAB — HEMOGLOBIN A1C
Hgb A1c MFr Bld: 6.4 % of total Hgb — ABNORMAL HIGH (ref <5.7)
Mean Plasma Glucose: 137 mg/dL
eAG (mmol/L): 7.6 mmol/L

## 2021-01-16 LAB — LIPID PANEL
Cholesterol: 204 mg/dL — ABNORMAL HIGH (ref <200)
HDL: 71 mg/dL (ref >=50)
LDL Cholesterol (Calc): 115 mg/dL (calc) — ABNORMAL HIGH
Non-HDL Cholesterol (Calc): 133 mg/dL (calc) — ABNORMAL HIGH (ref <130)
Total CHOL/HDL Ratio: 2.9 (calc) (ref <5.0)
Triglycerides: 84 mg/dL (ref <150)

## 2021-01-17 LAB — URINALYSIS W MICROSCOPIC + REFLEX CULTURE
Bacteria, UA: NONE SEEN /HPF
Bilirubin Urine: NEGATIVE
Glucose, UA: NEGATIVE
Hgb urine dipstick: NEGATIVE
Hyaline Cast: NONE SEEN /LPF
Ketones, ur: NEGATIVE
Nitrites, Initial: NEGATIVE
Protein, ur: NEGATIVE
RBC / HPF: NONE SEEN /HPF (ref 0–2)
Specific Gravity, Urine: 1.014 (ref 1.001–1.035)
pH: 7 (ref 5.0–8.0)

## 2021-01-17 LAB — URINE CULTURE
MICRO NUMBER:: 12871009
Result:: NO GROWTH
SPECIMEN QUALITY:: ADEQUATE

## 2021-01-17 LAB — CULTURE INDICATED

## 2021-01-19 DIAGNOSIS — H25013 Cortical age-related cataract, bilateral: Secondary | ICD-10-CM | POA: Diagnosis not present

## 2021-01-19 DIAGNOSIS — H353131 Nonexudative age-related macular degeneration, bilateral, early dry stage: Secondary | ICD-10-CM | POA: Diagnosis not present

## 2021-01-19 DIAGNOSIS — H40003 Preglaucoma, unspecified, bilateral: Secondary | ICD-10-CM | POA: Diagnosis not present

## 2021-01-19 NOTE — Telephone Encounter (Signed)
Patient informed of the message below.  Patient stated she was required to show immunity per [facility] and was clear.

## 2021-01-19 NOTE — Telephone Encounter (Signed)
So- was she required to show immunity to polio and they did a titer demonstrating that she was not immune to polio? If this is what she is referring to, then she should just need either three doses of IPV series, OR if she had childhood immunizations; she may just need an IPV booster. If we don't have IPV alone here at the office, she can get from health dept.

## 2021-01-20 ENCOUNTER — Other Ambulatory Visit: Payer: Self-pay | Admitting: Family Medicine

## 2021-01-20 DIAGNOSIS — E785 Hyperlipidemia, unspecified: Secondary | ICD-10-CM

## 2021-01-20 DIAGNOSIS — R7301 Impaired fasting glucose: Secondary | ICD-10-CM

## 2021-01-20 LAB — CYTOLOGY - PAP
Comment: NEGATIVE
Diagnosis: NEGATIVE
Diagnosis: REACTIVE
High risk HPV: NEGATIVE

## 2021-01-20 MED ORDER — ROSUVASTATIN CALCIUM 40 MG PO TABS: 40.0000 mg | ORAL_TABLET | Freq: Every day | ORAL | 3 refills | Status: AC

## 2021-02-04 ENCOUNTER — Other Ambulatory Visit: Payer: Self-pay | Admitting: Family Medicine

## 2021-02-04 ENCOUNTER — Other Ambulatory Visit: Payer: Self-pay

## 2021-02-04 ENCOUNTER — Ambulatory Visit (HOSPITAL_BASED_OUTPATIENT_CLINIC_OR_DEPARTMENT_OTHER)
Admission: RE | Admit: 2021-02-04 | Discharge: 2021-02-04 | Disposition: A | Discharge status: Home / Self Care | Payer: Medicare PPO | Source: Ambulatory Visit | Attending: Family Medicine | Admitting: Family Medicine

## 2021-02-04 DIAGNOSIS — E2839 Other primary ovarian failure: Secondary | ICD-10-CM | POA: Diagnosis not present

## 2021-02-04 DIAGNOSIS — M85832 Other specified disorders of bone density and structure, left forearm: Secondary | ICD-10-CM | POA: Diagnosis not present

## 2021-02-04 DIAGNOSIS — Z780 Asymptomatic menopausal state: Secondary | ICD-10-CM | POA: Diagnosis not present

## 2021-02-09 DIAGNOSIS — Z1283 Encounter for screening for malignant neoplasm of skin: Secondary | ICD-10-CM | POA: Diagnosis not present

## 2021-02-09 DIAGNOSIS — D225 Melanocytic nevi of trunk: Secondary | ICD-10-CM | POA: Diagnosis not present

## 2021-02-09 DIAGNOSIS — D485 Neoplasm of uncertain behavior of skin: Secondary | ICD-10-CM | POA: Diagnosis not present

## 2021-03-02 DIAGNOSIS — L988 Other specified disorders of the skin and subcutaneous tissue: Secondary | ICD-10-CM | POA: Diagnosis not present

## 2021-03-02 DIAGNOSIS — D485 Neoplasm of uncertain behavior of skin: Secondary | ICD-10-CM | POA: Diagnosis not present

## 2021-04-19 DIAGNOSIS — I10 Essential (primary) hypertension: Secondary | ICD-10-CM | POA: Diagnosis not present

## 2021-04-19 DIAGNOSIS — R011 Cardiac murmur, unspecified: Secondary | ICD-10-CM | POA: Diagnosis not present

## 2021-04-26 ENCOUNTER — Other Ambulatory Visit (INDEPENDENT_AMBULATORY_CARE_PROVIDER_SITE_OTHER): Payer: Medicare PPO

## 2021-04-26 DIAGNOSIS — E785 Hyperlipidemia, unspecified: Secondary | ICD-10-CM

## 2021-04-26 DIAGNOSIS — R7301 Impaired fasting glucose: Secondary | ICD-10-CM | POA: Diagnosis not present

## 2021-04-26 LAB — LIPID PANEL
Cholesterol: 167 mg/dL (ref 0–200)
HDL: 59.6 mg/dL (ref >39.00)
LDL Cholesterol: 85 mg/dL (ref 0–99)
NonHDL: 107.68
Total CHOL/HDL Ratio: 3
Triglycerides: 111 mg/dL (ref 0.0–149.0)
VLDL: 22.2 mg/dL (ref 0.0–40.0)

## 2021-04-26 LAB — COMPREHENSIVE METABOLIC PANEL
ALT: 21 U/L (ref 0–35)
AST: 18 U/L (ref 0–37)
Albumin: 4.3 g/dL (ref 3.5–5.2)
Alkaline Phosphatase: 48 U/L (ref 39–117)
BUN: 21 mg/dL (ref 6–23)
CO2: 31 mEq/L (ref 19–32)
Calcium: 9.4 mg/dL (ref 8.4–10.5)
Chloride: 104 mEq/L (ref 96–112)
Creatinine, Ser: 0.85 mg/dL (ref 0.40–1.20)
GFR: 69.88 mL/min (ref >60.00)
Glucose, Bld: 161 mg/dL — ABNORMAL HIGH (ref 70–99)
Potassium: 4.4 mEq/L (ref 3.5–5.1)
Sodium: 140 mEq/L (ref 135–145)
Total Bilirubin: 0.7 mg/dL (ref 0.2–1.2)
Total Protein: 6.9 g/dL (ref 6.0–8.3)

## 2021-04-26 LAB — HEMOGLOBIN A1C: Hgb A1c MFr Bld: 6.6 % — ABNORMAL HIGH (ref 4.6–6.5)

## 2021-05-24 DIAGNOSIS — I10 Essential (primary) hypertension: Secondary | ICD-10-CM | POA: Diagnosis not present

## 2021-05-24 DIAGNOSIS — R011 Cardiac murmur, unspecified: Secondary | ICD-10-CM | POA: Diagnosis not present

## 2021-06-02 DIAGNOSIS — I422 Other hypertrophic cardiomyopathy: Secondary | ICD-10-CM | POA: Diagnosis not present

## 2021-06-02 DIAGNOSIS — I10 Essential (primary) hypertension: Secondary | ICD-10-CM | POA: Diagnosis not present

## 2021-06-09 ENCOUNTER — Telehealth: Payer: Self-pay | Admitting: Family Medicine

## 2021-06-09 NOTE — Telephone Encounter (Signed)
Spoke with patient to schedule Medicare Annual Wellness Visit (AWV) either virtually or in office.   Patient stated she has several appts and wanted a call back in Sept  Last AWV 06/13/19 please schedule at anytime with [facility] Nurse Health Advisor 1 or 2

## 2021-06-29 ENCOUNTER — Other Ambulatory Visit: Payer: Self-pay | Admitting: Orthopedic Surgery

## 2021-06-29 ENCOUNTER — Ambulatory Visit
Admission: RE | Admit: 2021-06-29 | Discharge: 2021-06-29 | Disposition: A | Discharge status: Home / Self Care | Payer: Medicare PPO | Source: Ambulatory Visit | Attending: Orthopedic Surgery | Admitting: Orthopedic Surgery

## 2021-06-29 DIAGNOSIS — M25522 Pain in left elbow: Secondary | ICD-10-CM

## 2021-07-27 ENCOUNTER — Ambulatory Visit: Payer: Medicare PPO | Attending: Orthopedic Surgery | Admitting: Occupational Therapy

## 2021-07-27 ENCOUNTER — Encounter: Payer: Self-pay | Admitting: Occupational Therapy

## 2021-07-27 DIAGNOSIS — M25622 Stiffness of left elbow, not elsewhere classified: Secondary | ICD-10-CM | POA: Diagnosis present

## 2021-07-27 DIAGNOSIS — M6281 Muscle weakness (generalized): Secondary | ICD-10-CM | POA: Insufficient documentation

## 2021-07-27 DIAGNOSIS — M25522 Pain in left elbow: Secondary | ICD-10-CM | POA: Diagnosis not present

## 2021-07-27 NOTE — Therapy (Signed)
OUTPATIENT OCCUPATIONAL THERAPY ORTHO EVALUATION  Patient Name: Sheila Ramsey MRN: [id number] DOB:[birth year], 70 y.o., female Today's Date: 07/27/2021  PCP: Caren Macadam, MD REFERRING PROVIDER: Iran Planas, MD    OT End of Session - 07/27/21 1535     Visit Number 1    Number of Visits 17    Date for OT Re-Evaluation 10/25/21    Authorization Type [employer]    Authorization Time Period VL: TBD    OT Start Time 9518    OT Stop Time 8416    OT Time Calculation (min) 46 min    Behavior During Therapy WFL for tasks assessed/performed            Past Medical History:  Diagnosis Date   Arthritis    "hips, knees" (12/10/2013)   Cataract    Diverticular disease    Heart murmur    High cholesterol    Hypertension    Kidney stones    Macular degeneration    OSA on CPAP    PONV (postoperative nausea and vomiting)    Sleep apnea    Past Surgical History:  Procedure Laterality Date   EXTRACORPOREAL SHOCK WAVE LITHOTRIPSY Left    had stent placed and then removed prior to [place] ARTHROSCOPY Left 2014   TONSILLECTOMY     TOTAL HIP ARTHROPLASTY Right 09/27/2013   Procedure: RIGHT TOTAL HIP ARTHROPLASTY ANTERIOR APPROACH;  Surgeon: Gearlean Alf, MD;  Location: [place];  Service: Orthopedics;  Laterality: Right;   TOTAL HIP ARTHROPLASTY Left 12/10/2013   Procedure: LEFT TOTAL HIP ARTHROPLASTY ANTERIOR APPROACH;  Surgeon: Gearlean Alf, MD;  Location: [place];  Service: Orthopedics;  Laterality: Left;   Patient Active Problem List   Diagnosis Date Noted   Impaired fasting glucose 01/15/2021   Encounter for Medicare annual wellness exam 12/06/2018   Hypertension 05/04/2018   Hyperlipidemia 05/04/2018   OA (osteoarthritis) of hip 09/27/2013   DIVERTICULOSIS-COLON 08/10/2009   PERSONAL HX COLONIC POLYPS 08/10/2009    ONSET DATE: 06/29/21  REFERRING DIAG: S06.301: Pain in left elbow  THERAPY DIAG:  Pain in left  elbow  Stiffness of left elbow, not elsewhere classified  Muscle weakness (generalized)  Rationale for Evaluation and Treatment Rehabilitation  SUBJECTIVE:   SUBJECTIVE STATEMENT: Pt arrives to OP OT evaluation session w/ primary concern of returning to functional activities after sustaining an elbow fracture due to falling and tripping while working in her yard. She was placed in a sugartong orthosis at [facility] on 06/29/21 and reports she is worried about becoming too dependent on it. States she is ready and excited to be starting therapy. Also reports she was able to dry her hair by herself for the first time since onset this past weekend. Pt accompanied by: self  PERTINENT HISTORY: Closed fracture of capitellum of L humerus on 06/29/21; has been immobilized in sugartong orthosis since onset. PMH includes hypertrophic cardiomyopathy, white coat syndrome, HTN, HLD, and ventricular arrhythmia  PRECAUTIONS: Other: no lifting greater than 5 lbs  WEIGHT BEARING RESTRICTIONS  unknown  PAIN:  Are you having pain? No  FALLS: Has patient fallen in last 6 months? Yes. Number of falls - 1 at onset  LIVING ENVIRONMENT: Lives with: lives alone; 1 dog Lives in: House/apartment Stairs:  2 level; rail on L side going up  PLOF: Independent, Vocation/Vocational requirements: tutoring; part-time, and Leisure: dancing, gardening  PATIENT GOALS: Do yardwork "  not in fear;" wash and dry hair    OBJECTIVE:   HAND DOMINANCE: Right  ADLs: Overall ADLs: Pt reports she is able to complete most BADLs w/out assist, having difficulty mostly w/ higher level bilateral tasks due to involvement of non-dominant LUE and immobilization in elbow flexion (e.g., opening jars, washing and drying hair)  FUNCTIONAL OUTCOME MEASURES: Quick Dash: 6.8%  UPPER EXTREMITY ROM     Active ROM Right Eval - 7/25 Left Eval - 7/25  Shoulder flexion [facility]  Shoulder extension    Shoulder abduction    Shoulder  adduction    Shoulder internal rotation    Shoulder external rotation    Elbow flexion 147 130  Elbow extension 6 26  Wrist flexion [facility] WFL  Wrist extension    Wrist ulnar deviation    Wrist radial deviation    Wrist pronation [facility] WFL  Wrist supination    (Blank rows = not tested)  UPPER EXTREMITY MMT:    Not tested at evaluation due to medical restrictions; to be assessed  HAND FUNCTION: Not tested at evaluation due to medical restrictions; to be assessed  COORDINATION: WFL  SENSATION: WFL; premorbid bil numbness in fingertips, per pt report  EDEMA: No observable edema  COGNITION: Overall cognitive status: Within functional limits for tasks assessed   TODAY'S TREATMENT: Introduced HEP (see HEP below); pt returned demonstration of all exercises w/out difficulty or pain Orthosis modification: flared out medial border of proximal end of orthosis and dorsal and ventral borders of distal end of orthosis for decreased irritation; no areas of pressure observed after adjustment w/ pt confirming comfortable fit   PATIENT EDUCATION: Educated on role and purpose of OT as well as potential interventions and goals for therapy based on initial evaluation findings. Provided condition-specific education Person educated: Patient Education method: Explanation Education comprehension: verbalized understanding   HOME EXERCISE PROGRAM: To be completed 4-5x/day; 15 reps initially, building up to 25 reps each (printed handout provided) - Elbow flexion/extension - Forearm supination w/ elbow flexion/pronation w/ elbow extension - Wrist flexion/extension  GOALS: Goals reviewed with patient? No  SHORT TERM GOALS: Target date:  09/07/21       Status:  1 Pt will improve AROM in L elbow to at least 15 degrees extension to restore functional motion for reach Baseline: 26 deg of ext Initial  2 Pt will demonstrate independence w/ HEP designed for LUE ROM, stretching, and strengthening   Baseline: initiated during evaluation Initial  3 Pt will improve grip strength in L hand to at least 20 lbs for functional use at home and in gardening tasks Baseline: not tested in evaluation Initial     LONG TERM GOALS: Target date:  10/25/21       Status:  1 Pt will increase functional use of LUE duringIADLs and leisure tasks as evidenced by improving QuickDASH score from 6.8% to 0% impairment by discharge Baseline: 6.8/100 Initial  2 Pt will improve AROM in L elbow flex and ext to within 10 degrees of RUE elbow AROM for improved participation in functional tasks Baseline: RUE: 6-147 deg Initial  3 Pt will increase strength in LUE to at least 4/5 MMT for improved functional ability to carry out self-care and higher-level homecare tasks with no difficulty  Baseline: not tested in evaluation Initial     ASSESSMENT:  CLINICAL IMPRESSION: Pt is a 71 y/o who presents to OP OT due to decreased ROM and strength of LUE s/p closed fracture of capitellum of  L humerus on 06/28/21. PMH includes hypertrophic cardiomyopathy, white coat syndrome, HTN, HLD, and ventricular arrhythmia. Pt is being treated non-surgically and was most recently placed in a sugartong orthosis via [facility] on 06/29/21. Pt will benefit from skilled occupational therapy services to address aforementioned deficits, as well as implementation and guided progression of an HEP per protocol to improve participation with all ADLs and ensure maximal function use of LUE at non-dominant level.   PERFORMANCE DEFICITS in functional skills including sensation, ROM, strength, pain, body mechanics, and UE functional use.  IMPAIRMENTS are limiting patient from ADLs, IADLs, work, leisure, and social participation.   COMORBIDITIES may have co-morbidities  that affects occupational performance. Patient will benefit from skilled OT to address above impairments and improve overall function.  MODIFICATION OR ASSISTANCE TO COMPLETE EVALUATION: No  modification of tasks or assist necessary to complete an evaluation.  OT OCCUPATIONAL PROFILE AND HISTORY: Problem focused assessment: Including review of records relating to presenting problem.  CLINICAL DECISION MAKING: Moderate - several treatment options, min-mod task modification necessary  REHAB POTENTIAL: Excellent  EVALUATION COMPLEXITY: Low   PLAN: OT FREQUENCY: 1-2x/week  OT DURATION: 12 weeks  PLANNED INTERVENTIONS: self care/ADL training, therapeutic exercise, therapeutic activity, manual therapy, passive range of motion, splinting, electrical stimulation, ultrasound, iontophoresis, compression bandaging, moist heat, cryotherapy, patient/family education, and DME and/or AE instructions  RECOMMENDED OTHER SERVICES: None  CONSULTED AND AGREED WITH PLAN OF CARE: Patient  PLAN FOR NEXT SESSION: Review AROM; introduce AAROM per protocol   Kathrine Cords, MSOT, OTR/L 07/27/2021, 4:29 PM

## 2021-08-03 ENCOUNTER — Ambulatory Visit: Payer: Medicare PPO | Attending: Orthopedic Surgery | Admitting: Occupational Therapy

## 2021-08-03 ENCOUNTER — Encounter: Payer: Self-pay | Admitting: Occupational Therapy

## 2021-08-03 DIAGNOSIS — M25622 Stiffness of left elbow, not elsewhere classified: Secondary | ICD-10-CM

## 2021-08-03 DIAGNOSIS — M6281 Muscle weakness (generalized): Secondary | ICD-10-CM | POA: Diagnosis present

## 2021-08-03 DIAGNOSIS — M25522 Pain in left elbow: Secondary | ICD-10-CM | POA: Diagnosis present

## 2021-08-03 NOTE — Therapy (Signed)
OUTPATIENT OCCUPATIONAL THERAPY TREATMENT NOTE  Patient Name: Sheila Ramsey MRN: [id number] DOB:[birth year], 71 y.o., female Today's Date: 08/03/2021  PCP: Caren Macadam, MD REFERRING PROVIDER: Iran Planas, MD    OT End of Session - 08/03/21 0805     Visit Number 2    Number of Visits 17    Date for OT Re-Evaluation 10/25/21    Authorization Type [employer]    Authorization - Visit Number 1    Authorization - Number of Visits 17    OT Start Time 0802    OT Stop Time 0845    OT Time Calculation (min) 43 min    Activity Tolerance Patient tolerated treatment well    Behavior During Therapy WFL for tasks assessed/performed            Past Medical History:  Diagnosis Date   Arthritis    "hips, knees" (12/10/2013)   Cataract    Diverticular disease    Heart murmur    High cholesterol    Hypertension    Kidney stones    Macular degeneration    OSA on CPAP    PONV (postoperative nausea and vomiting)    Sleep apnea    Past Surgical History:  Procedure Laterality Date   EXTRACORPOREAL SHOCK WAVE LITHOTRIPSY Left    had stent placed and then removed prior to [place] ARTHROSCOPY Left 2014   TONSILLECTOMY     TOTAL HIP ARTHROPLASTY Right 09/27/2013   Procedure: RIGHT TOTAL HIP ARTHROPLASTY ANTERIOR APPROACH;  Surgeon: Gearlean Alf, MD;  Location: [place];  Service: Orthopedics;  Laterality: Right;   TOTAL HIP ARTHROPLASTY Left 12/10/2013   Procedure: LEFT TOTAL HIP ARTHROPLASTY ANTERIOR APPROACH;  Surgeon: Gearlean Alf, MD;  Location: [place];  Service: Orthopedics;  Laterality: Left;   Patient Active Problem List   Diagnosis Date Noted   Impaired fasting glucose 01/15/2021   Encounter for Medicare annual wellness exam 12/06/2018   Hypertension 05/04/2018   Hyperlipidemia 05/04/2018   OA (osteoarthritis) of hip 09/27/2013   DIVERTICULOSIS-COLON 08/10/2009   PERSONAL HX COLONIC POLYPS 08/10/2009    ONSET DATE:  06/29/21  REFERRING DIAG: T65.465: Pain in left elbow  THERAPY DIAG:  Pain in left elbow  Stiffness of left elbow, not elsewhere classified  Muscle weakness (generalized)  Rationale for Evaluation and Treatment Rehabilitation  SUBJECTIVE:   SUBJECTIVE STATEMENT: Pt reports lateral border of her forearm is a little sore, mostly some tenderness. Also states she was able to wash and dry her hair this weekend, which was one of her biggest goals. Pt accompanied by: self  PAIN: Are you having pain? No  PERTINENT HISTORY: Closed fracture of capitellum of L humerus on 06/29/21; has been immobilized in sugartong orthosis since onset. PMH includes hypertrophic cardiomyopathy, white coat syndrome, HTN, HLD, and ventricular arrhythmia  PRECAUTIONS: Mid-range AROM initially; full AROM at 2 weeks; PROM at 3 weeks; wean orthosis at 4 weeks w/ light weighted resistance and repetitive motion allowed; progressive endurance/strengthening at 8 weeks  FALLS: Has patient fallen in last 6 months? Yes. Number of falls - 1 at onset  LIVING ENVIRONMENT: Lives with: lives alone; 1 dog Lives in: House/apartment Stairs:  2 level; rail on L side going up  PLOF: Independent, Vocation/Vocational requirements: tutoring; part-time, and Leisure: dancing, gardening  PATIENT GOALS: Do yardwork "not in fear;" wash and dry hair    OBJECTIVE:   HAND  DOMINANCE: Right  UPPER EXTREMITY ROM     Active ROM Right Eval - 7/25 Left Eval - 7/25  Shoulder flexion [facility]  Shoulder extension    Shoulder abduction    Shoulder adduction    Shoulder internal rotation    Shoulder external rotation    Elbow flexion 147 130  Elbow extension 6 26  Wrist flexion [facility] WFL  Wrist extension    Wrist ulnar deviation    Wrist radial deviation    Wrist pronation [facility] WFL  Wrist supination    (Blank rows = not tested)   TODAY'S TREATMENT:  ROM Exercises AAROM of L forearm sup/pro x15 w/ OT providing initial  demonstration and occ verbal cues throughout for alignment/positioning; completed w/ elbow flexed and forearm resting on tabletop  AROM of L forearm sup/pro x15 w/ elbow flexed and arm adducted to side; completed w/out difficulty and mild decreased arc of motion w/ pronation at end range  AROM of L wrist flex/ext x15 w/ elbow flexed, arm adducted to side, and forearm in neutral; completed w/out difficulty  AAROM of L elbow flex/ext w/ unweighted dowel, emphasizing full extension each rep  Orthosis Management Discussed continued weaning of long-arm orthosis and potential benefit of prefabricated elbow support brace to allow for continued protection during ADLs and IADLs at home, pain management, and support prn; handout provided to pt of option for self-purchase  Light Hand Strengthening Gross grasp of putty w/ L hand, focusing on attempting movement pattern against resistance opposed to straining to force movement, for light strengthening of forearm and grip strength; completed x25 w/out pain     PATIENT EDUCATION: Ongoing condition-specific education related to therapeutic interventions completed this session Person educated: Patient Education method: Explanation Education comprehension: verbalized understanding   HOME EXERCISE PROGRAM: To be completed 4-5x/day; 15 reps initially, building up to 25 reps each (printed handout provided) - Elbow flexion/extension - Forearm supination w/ elbow flexion/pronation w/ elbow extension - Wrist flexion/extension  GOALS: Goals reviewed with patient? Yes  SHORT TERM GOALS: Target date:  09/07/21       Status:  1 Pt will improve AROM in L elbow to at least 15 degrees extension to restore functional motion for reach Baseline: 26 deg of ext Progressing  2 Pt will demonstrate independence w/ HEP designed for LUE ROM, stretching, and strengthening  Baseline: initiated during evaluation Progressing  3 Pt will improve grip strength in L hand to at least  20 lbs for functional use at home and in gardening tasks Baseline: not tested in evaluation Progressing     LONG TERM GOALS: Target date:  10/25/21       Status:  1 Pt will increase functional use of LUE duringIADLs and leisure tasks as evidenced by improving QuickDASH score from 6.8% to 0% impairment by discharge Baseline: 6.8/100 Progressing  2 Pt will improve AROM in L elbow flex and ext to within 10 degrees of RUE elbow AROM for improved participation in functional tasks Baseline: RUE: 6-147 deg Progressing  3 Pt will increase strength in LUE to at least 4/5 MMT for improved functional ability to carry out self-care and higher-level homecare tasks with no difficulty  Baseline: not tested in evaluation Progressing     ASSESSMENT:  CLINICAL IMPRESSION: Pt is currently 5 weeks, 1 day s/o closed fracture of capitellum of L humerus on 06/28/21. OT reviewed goals w/ pt and initial AROM exercises introduced in prior session w/ pt demonstrating good understanding. Per protocol, OT also incorporated  active-assisted exercises focusing on forearm rotation and elbow flex/ext, as well as light hand strengthening w/ yellow theraputty administered to pt at conclusion of session. Pt demonstrated decreased pronation and full elbow arc of motion, but was able to complete all exercises w/out pain. Will continue to progress and update HEP as appropriate to maximize functional recovery of LUE at non-dominant level.  PERFORMANCE DEFICITS in functional skills including sensation, ROM, strength, pain, body mechanics, and UE functional use.  IMPAIRMENTS are limiting patient from ADLs, IADLs, work, leisure, and social participation.   COMORBIDITIES may have co-morbidities  that affects occupational performance. Patient will benefit from skilled OT to address above impairments and improve overall function.   PLAN: OT FREQUENCY: 1-2x/week  OT DURATION: 12 weeks  PLANNED INTERVENTIONS: self care/ADL training,  therapeutic exercise, therapeutic activity, manual therapy, passive range of motion, splinting, electrical stimulation, ultrasound, iontophoresis, compression bandaging, moist heat, cryotherapy, patient/family education, and DME and/or AE instructions  RECOMMENDED OTHER SERVICES: None  CONSULTED AND AGREED WITH PLAN OF CARE: Patient  PLAN FOR NEXT SESSION: Continue w/ elbow/forearm AAROM; PROM of elbow flexion/extension in supine   Kathrine Cords, MSOT, OTR/L 08/03/2021, 10:31 AM

## 2021-08-04 ENCOUNTER — Ambulatory Visit: Payer: Medicare PPO | Admitting: Physical Therapy

## 2021-08-10 ENCOUNTER — Ambulatory Visit: Payer: Medicare PPO | Admitting: Occupational Therapy

## 2021-08-12 ENCOUNTER — Encounter: Payer: Self-pay | Admitting: Occupational Therapy

## 2021-08-12 ENCOUNTER — Ambulatory Visit: Payer: Medicare PPO | Admitting: Occupational Therapy

## 2021-08-12 DIAGNOSIS — M25622 Stiffness of left elbow, not elsewhere classified: Secondary | ICD-10-CM

## 2021-08-12 DIAGNOSIS — M25522 Pain in left elbow: Secondary | ICD-10-CM | POA: Diagnosis not present

## 2021-08-12 DIAGNOSIS — M6281 Muscle weakness (generalized): Secondary | ICD-10-CM

## 2021-08-12 NOTE — Patient Instructions (Signed)
ELBOW: Extension - Supine    Place a towel roll or pillow under your upper arm. Straighten out your elbow slowly, trying to keep your thumb up toward the ceiling. Completed 15 reps per set, 2 sets per session; about 3x/day

## 2021-08-12 NOTE — Therapy (Signed)
OUTPATIENT OCCUPATIONAL THERAPY TREATMENT NOTE  Patient Name: Sheila Ramsey MRN: [id number] DOB:1950/08/29, 71 y.o., female Today's Date: 08/12/2021  PCP: Caren Macadam, MD REFERRING PROVIDER: Iran Planas, MD    OT End of Session - 08/12/21 [phone]     Visit Number 3    Number of Visits 17    Date for OT Re-Evaluation 10/25/21    Authorization Type [employer]    Authorization - Visit Number 2    Authorization - Number of Visits 17    OT Start Time [phone]    OT Stop Time 1011    OT Time Calculation (min) 40 min    Activity Tolerance Patient tolerated treatment well    Behavior During Therapy WFL for tasks assessed/performed            Past Medical History:  Diagnosis Date   Arthritis    "hips, knees" (12/10/2013)   Cataract    Diverticular disease    Heart murmur    High cholesterol    Hypertension    Kidney stones    Macular degeneration    OSA on CPAP    PONV (postoperative nausea and vomiting)    Sleep apnea    Past Surgical History:  Procedure Laterality Date   EXTRACORPOREAL SHOCK WAVE LITHOTRIPSY Left    had stent placed and then removed prior to [place] ARTHROSCOPY Left 2014   TONSILLECTOMY     TOTAL HIP ARTHROPLASTY Right 09/27/2013   Procedure: RIGHT TOTAL HIP ARTHROPLASTY ANTERIOR APPROACH;  Surgeon: Gearlean Alf, MD;  Location: [facility];  Service: Orthopedics;  Laterality: Right;   TOTAL HIP ARTHROPLASTY Left 12/10/2013   Procedure: LEFT TOTAL HIP ARTHROPLASTY ANTERIOR APPROACH;  Surgeon: Gearlean Alf, MD;  Location: [facility];  Service: Orthopedics;  Laterality: Left;   Patient Active Problem List   Diagnosis Date Noted   Impaired fasting glucose 01/15/2021   Encounter for Medicare annual wellness exam 12/06/2018   Hypertension 05/04/2018   Hyperlipidemia 05/04/2018   OA (osteoarthritis) of hip 09/27/2013   DIVERTICULOSIS-COLON 08/10/2009   PERSONAL HX COLONIC POLYPS 08/10/2009    ONSET DATE:  06/29/21  REFERRING DIAG: L79.892: Pain in left elbow  THERAPY DIAG:  Stiffness of left elbow, not elsewhere classified  Muscle weakness (generalized)  Pain in left elbow  Rationale for Evaluation and Treatment Rehabilitation  SUBJECTIVE:   SUBJECTIVE STATEMENT: Pt reports she is not really wearing her orthosis anymore and has not ordered a brace yet because she was out of town. No concerns w/ pain since prior visit. Pt accompanied by: self  PAIN: Are you having pain? No  PERTINENT HISTORY: Closed fracture of capitellum of L humerus on 06/29/21; has been immobilized in sugartong orthosis since onset. PMH includes hypertrophic cardiomyopathy, white coat syndrome, HTN, HLD, and ventricular arrhythmia  PRECAUTIONS: Mid-range AROM initially; full AROM at 2 weeks; PROM at 3 weeks; wean orthosis at 4 weeks w/ light weighted resistance and repetitive motion allowed; progressive endurance/strengthening at 8 weeks  PLOF: Independent, Vocation/Vocational requirements: tutoring; part-time, and Leisure: dancing, gardening  PATIENT GOALS: Do yardwork "not in fear;" wash and dry hair    OBJECTIVE:   HAND DOMINANCE: Right  UPPER EXTREMITY ROM     Active ROM Right Eval - 7/25 Left Eval - 7/25 Left 8/10  Shoulder flexion [facility]   Shoulder extension     Shoulder abduction     Shoulder adduction  Shoulder internal rotation     Shoulder external rotation     Elbow flexion 147 130 145  Elbow extension -_0 Wrist flexion [facility] WFL   Wrist extension     Wrist ulnar deviation     Wrist radial deviation     Wrist pronation [facility] WFL   Wrist supination     (Blank rows = not tested)   TODAY'S TREATMENT:  ROM Exercises OT performed gentle PROM of L elbow ext in supine position to decrease stiffness and increase ROM; competed 2 sets of 10 reps slowly. Able to achieve full ROM w/out pain; pt tolerated exercise w/out discomfort.  AROM of L elbow ext 2x15 in supine position w/ towel  roll positioned under UE for improved alignment; verbal cues and demonstration required initially for positioning during extension  Pulleys for L shoulder overhead flex/elbow ext completed 4x10 slowly w/out difficulty and good technique  Forearm Exercises L forearm sup/pro w/ mallet completed 2x15 w/out difficulty w/ R hand under LUE as a cue for positioning throughout; able to complete w/out pain    PATIENT EDUCATION: Ongoing condition-specific education related to therapeutic interventions completed this session; reviewed and updated HEP based on progress Person educated: Patient Education method: Explanation Education comprehension: verbalized understanding   HOME EXERCISE PROGRAM: See patient instructions  GOALS: Goals reviewed with patient? Yes  SHORT TERM GOALS: Target date:  09/07/21       Status:  1 Pt will improve AROM in L elbow to at least 15 degrees extension to restore functional motion for reach Baseline: 26 deg of ext Met - 08/12/21: 13 deg  2 Pt will demonstrate independence w/ HEP designed for LUE ROM, stretching, and strengthening  Baseline: initiated during evaluation Progressing  3 Pt will improve grip strength in L hand to at least 20 lbs for functional use at home and in gardening tasks Baseline: not tested in evaluation Progressing     LONG TERM GOALS: Target date:  10/25/21       Status:  1 Pt will increase functional use of LUE duringIADLs and leisure tasks as evidenced by improving QuickDASH score from 6.8% to 0% impairment by discharge Baseline: 6.8/100 Progressing  2 Pt will improve AROM in L elbow flex and ext to within 10 degrees of RUE elbow AROM for improved participation in functional tasks Baseline: RUE: -4-147 deg Progressing  3 Pt will increase strength in LUE to at least 4/5 MMT for improved functional ability to carry out self-care and higher-level homecare tasks with no difficulty  Baseline: not tested in evaluation Progressing      ASSESSMENT:  CLINICAL IMPRESSION: Pt is currently 6 weeks, 2 days s/p closed fracture of capitellum of L humerus on 06/28/21. AROM of elbow reassessed this session w/ pt demonstrating flexion WFL and improved extension by 13 degrees since prior measurement. Per protocol, OT continued w/ AAROM, AROM, and therapist-facilitate gentle PROM, focusing on remaining elbow ext. Pt able to complete all exercises w/out increased pain and occ cues for alignment and technique. Good success achieving ROM in supine position w/ gravity-assist. Will continue to progress and update HEP as appropriate to maximize functional recovery of LUE at non-dominant level.  PERFORMANCE DEFICITS in functional skills including sensation, ROM, strength, pain, body mechanics, and UE functional use.  IMPAIRMENTS are limiting patient from ADLs, IADLs, work, leisure, and social participation.   COMORBIDITIES may have co-morbidities  that affects occupational performance. Patient will benefit from skilled OT to address above impairments and  improve overall function.   PLAN: OT FREQUENCY: 1-2x/week  OT DURATION: 12 weeks  PLANNED INTERVENTIONS: self care/ADL training, therapeutic exercise, therapeutic activity, manual therapy, passive range of motion, splinting, electrical stimulation, ultrasound, iontophoresis, compression bandaging, moist heat, cryotherapy, patient/family education, and DME and/or AE instructions  RECOMMENDED OTHER SERVICES: None  CONSULTED AND AGREED WITH PLAN OF CARE: Patient  PLAN FOR NEXT SESSION: Continue w/ elbow extension A/P/AAROM; progress to light resistance for L elbow, forearm, wrist, and hand   Kathrine Cords, MSOT, OTR/L 08/12/2021, 10:22 AM

## 2021-08-16 ENCOUNTER — Encounter: Payer: Self-pay | Admitting: Occupational Therapy

## 2021-08-16 ENCOUNTER — Ambulatory Visit: Payer: Medicare PPO | Admitting: Occupational Therapy

## 2021-08-16 DIAGNOSIS — M6281 Muscle weakness (generalized): Secondary | ICD-10-CM

## 2021-08-16 DIAGNOSIS — M25522 Pain in left elbow: Secondary | ICD-10-CM | POA: Diagnosis not present

## 2021-08-16 DIAGNOSIS — M25622 Stiffness of left elbow, not elsewhere classified: Secondary | ICD-10-CM

## 2021-08-16 NOTE — Therapy (Addendum)
OUTPATIENT OCCUPATIONAL THERAPY TREATMENT NOTE  Patient Name: Sheila Ramsey MRN: [id number] DOB:September 04, 1950, 71 y.o., female Today's Date: 08/16/2021  PCP: Caren Macadam, MD REFERRING PROVIDER: Iran Planas, MD    OT End of Session - 08/16/21 1703     Visit Number 4    Number of Visits 17    Date for OT Re-Evaluation 10/25/21    Authorization Type [employer]    Authorization - Visit Number 3    Authorization - Number of Visits 17    OT Start Time 1700    OT Stop Time 4650    OT Time Calculation (min) 48 min    Activity Tolerance Patient tolerated treatment well    Behavior During Therapy WFL for tasks assessed/performed            Past Medical History:  Diagnosis Date   Arthritis    "hips, knees" (12/10/2013)   Cataract    Diverticular disease    Heart murmur    High cholesterol    Hypertension    Kidney stones    Macular degeneration    OSA on CPAP    PONV (postoperative nausea and vomiting)    Sleep apnea    Past Surgical History:  Procedure Laterality Date   EXTRACORPOREAL SHOCK WAVE LITHOTRIPSY Left    had stent placed and then removed prior to [place] ARTHROSCOPY Left 2014   TONSILLECTOMY     TOTAL HIP ARTHROPLASTY Right 09/27/2013   Procedure: RIGHT TOTAL HIP ARTHROPLASTY ANTERIOR APPROACH;  Surgeon: Gearlean Alf, MD;  Location: [facility];  Service: Orthopedics;  Laterality: Right;   TOTAL HIP ARTHROPLASTY Left 12/10/2013   Procedure: LEFT TOTAL HIP ARTHROPLASTY ANTERIOR APPROACH;  Surgeon: Gearlean Alf, MD;  Location: [facility];  Service: Orthopedics;  Laterality: Left;   Patient Active Problem List   Diagnosis Date Noted   Impaired fasting glucose 01/15/2021   Encounter for Medicare annual wellness exam 12/06/2018   Hypertension 05/04/2018   Hyperlipidemia 05/04/2018   OA (osteoarthritis) of hip 09/27/2013   DIVERTICULOSIS-COLON 08/10/2009   PERSONAL HX COLONIC POLYPS 08/10/2009    ONSET DATE:  06/29/21  REFERRING DIAG: P54.656: Pain in left elbow  THERAPY DIAG:  Stiffness of left elbow, not elsewhere classified  Muscle weakness (generalized)  Pain in left elbow  Rationale for Evaluation and Treatment Rehabilitation  SUBJECTIVE:   SUBJECTIVE STATEMENT: Pt reports she did not have her follow-up w/ Dr. Caralyn Guile on Friday because he had to go out of town; will need to reschedule Pt accompanied by: self  PAIN: Are you having pain? No  PERTINENT HISTORY:  Closed fracture of capitellum of L humerus on 06/29/21; has been immobilized in sugartong orthosis since onset. PMH includes hypertrophic cardiomyopathy, white coat syndrome, HTN, HLD, and ventricular arrhythmia  PRECAUTIONS: Mid-range AROM initially; full AROM at 2 weeks; PROM at 3 weeks; wean orthosis at 4 weeks w/ light weighted resistance and repetitive motion allowed; progressive endurance/strengthening at 8 weeks  PLOF: Independent, Vocation/Vocational requirements: tutoring; part-time, and Leisure: dancing, gardening  PATIENT GOALS: Do yardwork "not in fear;" wash and dry hair    OBJECTIVE:   HAND DOMINANCE: Right  UPPER EXTREMITY ROM     Active ROM Right Eval - 7/25 Left Eval - 7/25 Left 8/10 Left 8/14  Shoulder flexion [facility]    Shoulder extension      Shoulder abduction      Shoulder  adduction      Shoulder internal rotation      Shoulder external rotation      Elbow flexion 147 130 145   Elbow extension -'4 26 13 9  ' Wrist flexion [facility] WFL    Wrist extension      Wrist ulnar deviation      Wrist radial deviation      Wrist pronation [facility] WFL    Wrist supination      (Blank rows = not tested)   TODAY'S TREATMENT:  Elbow Extension AROM of L elbow ext 3x15 while sitting in a chair w/ elbow resting on pillow; OT provided occ verbal cues/demonstration for full arc of motion each rep  LUE Light Strengthening Wrist ext w/ 2 lb dumbbell; 2x15 w/ forearm pronated on tabletop surface. OT provided  tactile/visual cue during first set for alignment and positioning to prevent radial deviation.  Wrist flex w/ 2 lb dumbbell; 2x15 w/ forearm supinated on tabletop surface  Forearm sup/pro w/ 2 lb dumbbell; 2x15 w/ good technique and no incr pain  Elbow flex/ext w/ 2 lb dumbbell; forearm in neutral; 2x15 w/out incr pain  Arm Bike Reciprocal arm bike for conditioning and BUE strengthening on Level 1.5 for 5 minutes total (forward and backward)     PATIENT EDUCATION: Ongoing condition-specific education related to therapeutic interventions completed this session; reviewed and updated HEP based on progress Person educated: Patient Education method: Explanation Education comprehension: verbalized understanding   HOME EXERCISE PROGRAM: Med[employer] Access Code: [email or web address] - Putty Squeezes  - 2-3 x daily - 1 sets - 25 reps - Elbow AROM Blocked Extension in Neutral Position  - 2-3 x daily - 1 sets - 10 reps - 5 sec hold - Seated Wrist Extension with Dumbbell  - 2-3 x daily - 2 sets - 15 reps - Seated Wrist Flexion with Dumbbell  - 2-3 x daily - 2 sets - 15 reps - Forearm Pronation and Supination with Hammer  - 2-3 x daily - 2 sets - 15 reps - Seated Single Arm Bicep Curls Neutral with Dumbbell  - 2-3 x daily - 2 sets - 15 reps  GOALS: Goals reviewed with patient? Yes  SHORT TERM GOALS: Target date:  09/07/21       Status:  1 Pt will improve AROM in L elbow to at least 15 degrees extension to restore functional motion for reach Baseline: 26 deg of ext Met - 08/12/21: 13 deg  2 Pt will demonstrate independence w/ HEP designed for LUE ROM, stretching, and strengthening  Baseline: initiated during evaluation Progressing  3 Pt will improve grip strength in L hand to at least 20 lbs for functional use at home and in gardening tasks Baseline: not tested in evaluation Met - 08/16/21:  49 lbs     LONG TERM GOALS: Target date:  10/25/21       Status:  1 Pt will increase functional use of LUE during  IADLs and leisure tasks as evidenced by improving QuickDASH score from 6.8% to 0% impairment by discharge Baseline: 6.8/100 Progressing  2 Pt will improve AROM in L elbow flex and ext to within 10 degrees of RUE elbow AROM for improved participation in functional tasks Baseline: RUE: -4-147 deg Progressing  3 Pt will increase strength in LUE to at least 4/5 MMT for improved functional ability to carry out self-care and higher-level homecare tasks with no difficulty  Baseline: not tested in evaluation Progressing     ASSESSMENT:  CLINICAL IMPRESSION:  Pt is currently 7 weeks s/p closed fracture of capitellum of L humerus on 06/28/21. AROM of elbow extension measured again today to ensure consistent gains w/ pt demonstrating improved extension by 4 degrees since prior measurement 3 days ago. Per protocol, OT continued w/ AAROM and AROM, focusing on remaining elbow ext, as well as introduction of light endurance building and strengthening. Pt able to complete all exercises w/out increased pain. Will continue to progress and update HEP as appropriate to maximize functional recovery of LUE at non-dominant level.  PERFORMANCE DEFICITS in functional skills including sensation, ROM, strength, pain, body mechanics, and UE functional use.  IMPAIRMENTS are limiting patient from ADLs, IADLs, work, leisure, and social participation.   COMORBIDITIES may have co-morbidities  that affects occupational performance. Patient will benefit from skilled OT to address above impairments and improve overall function.   PLAN: OT FREQUENCY: 1-2x/week  OT DURATION: 12 weeks  PLANNED INTERVENTIONS: self care/ADL training, therapeutic exercise, therapeutic activity, manual therapy, passive range of motion, splinting, electrical stimulation, ultrasound, iontophoresis, compression bandaging, moist heat, cryotherapy, patient/family education, and DME and/or AE instructions  RECOMMENDED OTHER SERVICES: None  CONSULTED AND  AGREED WITH PLAN OF CARE: Patient  PLAN FOR NEXT SESSION: Continue w/ elbow extension A/P/AAROM; progress light strengthening and endurance building for L elbow, forearm, wrist, and hand   Kathrine Cords, MSOT, OTR/L 08/16/2021, 5:48 PM

## 2021-08-26 ENCOUNTER — Ambulatory Visit: Payer: Medicare PPO | Admitting: Occupational Therapy

## 2021-08-26 ENCOUNTER — Encounter: Payer: Self-pay | Admitting: Occupational Therapy

## 2021-08-26 DIAGNOSIS — M25522 Pain in left elbow: Secondary | ICD-10-CM | POA: Diagnosis not present

## 2021-08-26 DIAGNOSIS — M25622 Stiffness of left elbow, not elsewhere classified: Secondary | ICD-10-CM

## 2021-08-26 DIAGNOSIS — M6281 Muscle weakness (generalized): Secondary | ICD-10-CM

## 2021-08-26 NOTE — Therapy (Signed)
OUTPATIENT OCCUPATIONAL THERAPY TREATMENT NOTE & DISCHARGE SUMMARY  Patient Name: Sheila Ramsey MRN: [id number] DOB:03/12/1950, 71 y.o., female Today's Date: 08/26/2021  PCP: Caren Macadam, MD REFERRING PROVIDER: Iran Planas, MD    OT End of Session - 08/26/21 [phone]     Visit Number 5    Number of Visits 17    Date for OT Re-Evaluation 10/25/21    Authorization Type [employer]    Authorization - Visit Number 4    Authorization - Number of Visits 17    OT Start Time 1191    OT Stop Time 0929    OT Time Calculation (min) 42 min    Activity Tolerance Patient tolerated treatment well    Behavior During Therapy WFL for tasks assessed/performed            OCCUPATIONAL THERAPY DISCHARGE SUMMARY  Visits from Start of Care: 5  Current functional level related to goals / functional outcomes: Pt reports no difficulty w/ functional activities at this time; all STGs and LTGs met   Remaining deficits: None   Education / Equipment: HEP; pain and edema management; condition-specific education   Patient agrees to discharge. Patient goals were met. Patient is being discharged due to being pleased with the current functional level.   Past Medical History:  Diagnosis Date   Arthritis    "hips, knees" (12/10/2013)   Cataract    Diverticular disease    Heart murmur    High cholesterol    Hypertension    Kidney stones    Macular degeneration    OSA on CPAP    PONV (postoperative nausea and vomiting)    Sleep apnea    Past Surgical History:  Procedure Laterality Date   EXTRACORPOREAL SHOCK WAVE LITHOTRIPSY Left    had stent placed and then removed prior to [place] ARTHROSCOPY Left 2014   TONSILLECTOMY     TOTAL HIP ARTHROPLASTY Right 09/27/2013   Procedure: RIGHT TOTAL HIP ARTHROPLASTY ANTERIOR APPROACH;  Surgeon: Gearlean Alf, MD;  Location: [facility];  Service: Orthopedics;  Laterality: Right;   TOTAL HIP ARTHROPLASTY  Left 12/10/2013   Procedure: LEFT TOTAL HIP ARTHROPLASTY ANTERIOR APPROACH;  Surgeon: Gearlean Alf, MD;  Location: [facility];  Service: Orthopedics;  Laterality: Left;   Patient Active Problem List   Diagnosis Date Noted   Impaired fasting glucose 01/15/2021   Encounter for Medicare annual wellness exam 12/06/2018   Hypertension 05/04/2018   Hyperlipidemia 05/04/2018   OA (osteoarthritis) of hip 09/27/2013   DIVERTICULOSIS-COLON 08/10/2009   PERSONAL HX COLONIC POLYPS 08/10/2009    ONSET DATE: 06/29/21  REFERRING DIAG: Y78.295: Pain in left elbow  THERAPY DIAG:  Stiffness of left elbow, not elsewhere classified  Muscle weakness (generalized)  Pain in left elbow  Rationale for Evaluation and Treatment Rehabilitation  SUBJECTIVE:   SUBJECTIVE STATEMENT: "I've worked harder on this than I have for anybody else" Pt accompanied by: self  PAIN: Are you having pain? No  PERTINENT HISTORY:  Closed fracture of capitellum of L humerus on 06/29/21; has been immobilized in sugartong orthosis since onset. PMH includes hypertrophic cardiomyopathy, white coat syndrome, HTN, HLD, and ventricular arrhythmia  PRECAUTIONS: Mid-range AROM initially; full AROM at 2 weeks; PROM at 3 weeks; wean orthosis at 4 weeks w/ light weighted resistance and repetitive motion allowed; progressive endurance/strengthening at 8 weeks  PLOF: Independent, Vocation/Vocational requirements: tutoring; part-time, and Leisure: dancing,  gardening  PATIENT GOALS: Do yardwork "not in fear;" wash and dry hair    OBJECTIVE:   HAND DOMINANCE: Right  UPPER EXTREMITY ROM     Active ROM Right Eval - 7/25 Left Eval - 7/25 Left 8/10 Left 8/14 Left 8/24  Shoulder flexion [facility]     Shoulder extension       Shoulder abduction       Shoulder adduction       Shoulder internal rotation       Shoulder external rotation       Elbow flexion 147 130 145 WFL WFL  Elbow extension -_0 Wrist flexion [facility] WFL      Wrist extension       Wrist ulnar deviation       Wrist radial deviation       Wrist pronation [facility] WFL     Wrist supination       (Blank rows = not tested)   TODAY'S TREATMENT:  Putty Exercises Gross grasp of putty w/ L hand, focusing on attempting movement pattern against resistance opposed to straining to force movement; completed x25 w/ red, med-soft putty  Pulleys Pulleys for L shoulder overhead flex/elbow ext completed x30 slowly w/out difficulty and good technique  LUE Light Strengthening Elbow flex/ext w/ 5 lb dumbbell; forearm in neutral; 2x15 w/out incr pain and rest breaks encouraged between sets  Forearm sup/pro w/ 5 lb dumbbell; 2x15 w/ good technique and no incr pain and rest breaks encouraged between sets    PATIENT EDUCATION: Ongoing condition-specific education related to objective measurements and interpretation of changes and improvements; discussed POC w/ pt agreeable to plan at this time Person educated: Patient Education method: Explanation Education comprehension: verbalized understanding   HOME EXERCISE PROGRAM: MedBridge Access Code: [email or web address] - Putty Squeezes  - 2-3 x daily - 1 sets - 25 reps - Elbow AROM Blocked Extension in Neutral Position  - 2-3 x daily - 1 sets - 10 reps - 5 sec hold - Seated Wrist Extension with Dumbbell  - 2-3 x daily - 2 sets - 15 reps - Seated Wrist Flexion with Dumbbell  - 2-3 x daily - 2 sets - 15 reps - Forearm Pronation and Supination with Hammer  - 2-3 x daily - 2 sets - 15 reps - Seated Single Arm Bicep Curls Neutral with Dumbbell  - 2-3 x daily - 2 sets - 15 reps  GOALS: Goals reviewed with patient? Yes  SHORT TERM GOALS: Target date:  09/07/21       Status:  1 Pt will improve AROM in L elbow to at least 15 degrees extension to restore functional motion for reach Baseline: 26 deg of ext Met - 08/12/21: 13 deg  2 Pt will demonstrate independence w/ HEP designed for LUE ROM, stretching, and strengthening  Baseline:  initiated during evaluation Met - 08/26/21  3 Pt will improve grip strength in L hand to at least 20 lbs for functional use at home and in gardening tasks Baseline: not tested in evaluation Met - 08/16/21:  49 lbs     LONG TERM GOALS: Target date:  10/25/21       Status:  1 Pt will increase functional use of LUE during IADLs and leisure tasks as evidenced by improving QuickDASH score from 6.8% to 0% impairment by discharge Baseline: 6.8/100 Met - 08/26/21:  0/100 indicating no difficulty  2 Pt will improve AROM in L elbow flex and ext to within  10 degrees of RUE elbow AROM for improved participation in functional tasks Baseline: RUE: -4-147 deg Met - 08/26/21  3 Pt will increase strength in LUE to at least 4/5 MMT for improved functional ability to carry out self-care and higher-level homecare tasks with no difficulty  Baseline: not tested in evaluation Met - 08/26/21: Grossly 4/5 w/ L shoulder/elbow     ASSESSMENT:  CLINICAL IMPRESSION: Pt is currently 8 weeks, 2 days s/p closed fracture of capitellum of L humerus on 06/28/21. AROM of elbow extension measured again today to ensure consistent gains w/ pt demonstrating improved extension by 8 degrees since prior measurement last week, now [facility] as compared to R side. Per protocol, OT incorporated progressive strengthening and muscle endurance w/ pt able to return demonstration of exercises targeting elbow and forearm w/out difficulty or increased pain. At this time, pt has shown improvements in functional use of LUE, ROM, pain, edema, and strength w/ all STGs and LTGs met. Pt is appropriate for d/c from skilled OT to HEP at this time, reports she is satisfied with progress, and is currently agreeable to discharge plan. Pt to be put on hold after today until follow-up w/ ortho MD to ensure no further therapy is warranted based on potential imaging results. Pt reports no further functional concerns at this time and is encouraged and excited about the  potential for d/c.  PERFORMANCE DEFICITS in functional skills including sensation, ROM, strength, pain, body mechanics, and UE functional use.  IMPAIRMENTS are limiting patient from ADLs, IADLs, work, leisure, and social participation.   COMORBIDITIES may have co-morbidities  that affects occupational performance. Patient will benefit from skilled OT to address above impairments and improve overall function.   PLAN: OT FREQUENCY: 1-2x/week  OT DURATION: 12 weeks  PLANNED INTERVENTIONS: self care/ADL training, therapeutic exercise, therapeutic activity, manual therapy, passive range of motion, splinting, electrical stimulation, ultrasound, iontophoresis, compression bandaging, moist heat, cryotherapy, patient/family education, and DME and/or AE instructions  RECOMMENDED OTHER SERVICES: None  CONSULTED AND AGREED WITH PLAN OF CARE: Patient  PLAN FOR NEXT SESSION: Pt put on hold until after MD follow-up. Will d/c at that time if no further therapy is warranted.    Kathrine Cords, MSOT, OTR/L 08/26/2021, 9:29 AM

## 2021-09-10 ENCOUNTER — Telehealth: Payer: Self-pay | Admitting: Family Medicine

## 2021-09-10 NOTE — Telephone Encounter (Signed)
Left message for patient to call back and schedule Medicare Annual Wellness Visit (AWV) either virtually or in office. Left  my Herbie Drape number [phone]   Last AWV ;06/12/20  please schedule at anytime with [facility] Nurse Health Advisor 1 or 2

## 2021-09-10 NOTE — Telephone Encounter (Signed)
Patient returned my call  she stated she has changed pcp.  She is seeing dr Maudie Mercury briscoe

## 2022-04-05 ENCOUNTER — Ambulatory Visit (HOSPITAL_COMMUNITY)
Admission: RE | Admit: 2022-04-05 | Discharge: 2022-04-05 | Disposition: A | Discharge status: Home / Self Care | Payer: Medicare PPO | Attending: Orthopedic Surgery | Admitting: Orthopedic Surgery

## 2022-04-05 ENCOUNTER — Other Ambulatory Visit: Payer: Self-pay

## 2022-04-05 ENCOUNTER — Encounter (HOSPITAL_COMMUNITY): Payer: Self-pay | Admitting: Orthopedic Surgery

## 2022-04-05 ENCOUNTER — Ambulatory Visit (HOSPITAL_COMMUNITY): Payer: Medicare PPO

## 2022-04-05 ENCOUNTER — Ambulatory Visit (HOSPITAL_BASED_OUTPATIENT_CLINIC_OR_DEPARTMENT_OTHER): Payer: Medicare PPO | Admitting: Certified Registered Nurse Anesthetist

## 2022-04-05 ENCOUNTER — Ambulatory Visit (HOSPITAL_COMMUNITY): Payer: Medicare PPO | Admitting: Certified Registered Nurse Anesthetist

## 2022-04-05 ENCOUNTER — Encounter (HOSPITAL_COMMUNITY)
Admission: RE | Disposition: A | Discharge status: Home / Self Care | Payer: Self-pay | Source: Home / Self Care | Attending: Orthopedic Surgery

## 2022-04-05 DIAGNOSIS — S52121A Displaced fracture of head of right radius, initial encounter for closed fracture: Secondary | ICD-10-CM | POA: Diagnosis not present

## 2022-04-05 DIAGNOSIS — Z87891 Personal history of nicotine dependence: Secondary | ICD-10-CM | POA: Diagnosis not present

## 2022-04-05 DIAGNOSIS — I10 Essential (primary) hypertension: Secondary | ICD-10-CM | POA: Insufficient documentation

## 2022-04-05 DIAGNOSIS — M1990 Unspecified osteoarthritis, unspecified site: Secondary | ICD-10-CM | POA: Diagnosis not present

## 2022-04-05 DIAGNOSIS — E7800 Pure hypercholesterolemia, unspecified: Secondary | ICD-10-CM | POA: Diagnosis not present

## 2022-04-05 DIAGNOSIS — S52021B Displaced fracture of olecranon process without intraarticular extension of right ulna, initial encounter for open fracture type I or II: Secondary | ICD-10-CM | POA: Insufficient documentation

## 2022-04-05 DIAGNOSIS — G4733 Obstructive sleep apnea (adult) (pediatric): Secondary | ICD-10-CM | POA: Insufficient documentation

## 2022-04-05 DIAGNOSIS — X58XXXA Exposure to other specified factors, initial encounter: Secondary | ICD-10-CM | POA: Insufficient documentation

## 2022-04-05 DIAGNOSIS — Z79899 Other long term (current) drug therapy: Secondary | ICD-10-CM | POA: Insufficient documentation

## 2022-04-05 DIAGNOSIS — E119 Type 2 diabetes mellitus without complications: Secondary | ICD-10-CM | POA: Insufficient documentation

## 2022-04-05 DIAGNOSIS — S52021A Displaced fracture of olecranon process without intraarticular extension of right ulna, initial encounter for closed fracture: Secondary | ICD-10-CM | POA: Diagnosis not present

## 2022-04-05 DIAGNOSIS — Z7984 Long term (current) use of oral hypoglycemic drugs: Secondary | ICD-10-CM | POA: Insufficient documentation

## 2022-04-05 DIAGNOSIS — S52121B Displaced fracture of head of right radius, initial encounter for open fracture type I or II: Secondary | ICD-10-CM | POA: Insufficient documentation

## 2022-04-05 HISTORY — DX: Unspecified fracture of lower end of left humerus, initial encounter for closed fracture: S42.402A

## 2022-04-05 HISTORY — PX: ORIF ELBOW FRACTURE: SHX5031

## 2022-04-05 HISTORY — DX: Myocarditis, unspecified: I51.4

## 2022-04-05 HISTORY — DX: Type 2 diabetes mellitus without complications: E11.9

## 2022-04-05 HISTORY — PX: RADIAL HEAD ARTHROPLASTY: SHX6044

## 2022-04-05 LAB — GLUCOSE, CAPILLARY
Glucose-Capillary: 173 mg/dL — ABNORMAL HIGH (ref 70–99)
Glucose-Capillary: 165 mg/dL — ABNORMAL HIGH (ref 70–99)
Glucose-Capillary: 171 mg/dL — ABNORMAL HIGH (ref 70–99)

## 2022-04-05 LAB — CBC
HCT: 41 % (ref 36.0–46.0)
Hemoglobin: 14.9 g/dL (ref 12.0–15.0)
MCH: 33.4 pg (ref 26.0–34.0)
MCHC: 36.3 g/dL — ABNORMAL HIGH (ref 30.0–36.0)
MCV: 91.9 fL (ref 80.0–100.0)
Platelets: 257 10*3/uL (ref 150–400)
RBC: 4.46 MIL/uL (ref 3.87–5.11)
RDW: 11.8 % (ref 11.5–15.5)
WBC: 7.7 10*3/uL (ref 4.0–10.5)
nRBC: 0 % (ref 0.0–0.2)

## 2022-04-05 LAB — BASIC METABOLIC PANEL
Anion gap: 12 (ref 5–15)
BUN: 15 mg/dL (ref 8–23)
CO2: 22 mmol/L (ref 22–32)
Calcium: 9.1 mg/dL (ref 8.9–10.3)
Chloride: 101 mmol/L (ref 98–111)
Creatinine, Ser: 0.68 mg/dL (ref 0.44–1.00)
GFR, Estimated: >60 mL/min (ref >60)
Glucose, Bld: 183 mg/dL — ABNORMAL HIGH (ref 70–99)
Potassium: 3.8 mmol/L (ref 3.5–5.1)
Sodium: 135 mmol/L (ref 135–145)

## 2022-04-05 SURGERY — OPEN REDUCTION INTERNAL FIXATION (ORIF) ELBOW/OLECRANON FRACTURE
Anesthesia: Monitor Anesthesia Care | Site: Elbow | Laterality: Right

## 2022-04-05 MED ORDER — CEFAZOLIN SODIUM-DEXTROSE 2-4 GM/100ML-% IV SOLN
2.0000 g | INTRAVENOUS | Status: AC
  Administered 2022-04-05: 2 g via INTRAVENOUS
  Filled 2022-04-05: qty 100

## 2022-04-05 MED ORDER — INSULIN ASPART 100 UNIT/ML IJ SOLN: 0.0000 [IU] | INTRAMUSCULAR | Status: DC | PRN

## 2022-04-05 MED ORDER — LACTATED RINGERS IV SOLN: INTRAVENOUS | Status: DC

## 2022-04-05 MED ORDER — MIDAZOLAM HCL 2 MG/2ML IJ SOLN
INTRAMUSCULAR | Status: AC
  Administered 2022-04-05: 2 mg via INTRAVENOUS
  Filled 2022-04-05: qty 2

## 2022-04-05 MED ORDER — PROPOFOL 500 MG/50ML IV EMUL
INTRAVENOUS | Status: DC | PRN
  Administered 2022-04-05: 75 ug/kg/min via INTRAVENOUS

## 2022-04-05 MED ORDER — ONDANSETRON HCL 4 MG/2ML IJ SOLN
INTRAMUSCULAR | Status: DC | PRN
  Administered 2022-04-05: 4 mg via INTRAVENOUS

## 2022-04-05 MED ORDER — PROPOFOL 1000 MG/100ML IV EMUL
INTRAVENOUS | Status: AC
  Filled 2022-04-05: qty 100

## 2022-04-05 MED ORDER — FENTANYL CITRATE (PF) 100 MCG/2ML IJ SOLN: 50.0000 ug | Freq: Once | INTRAMUSCULAR | Status: AC

## 2022-04-05 MED ORDER — 0.9 % SODIUM CHLORIDE (POUR BTL) OPTIME
TOPICAL | Status: DC | PRN
  Administered 2022-04-05 (×2): 1000 mL

## 2022-04-05 MED ORDER — PHENYLEPHRINE HCL-NACL 20-0.9 MG/250ML-% IV SOLN
INTRAVENOUS | Status: DC | PRN
  Administered 2022-04-05: 50 ug/min via INTRAVENOUS

## 2022-04-05 MED ORDER — ROPIVACAINE HCL 5 MG/ML IJ SOLN
INTRAMUSCULAR | Status: DC | PRN
  Administered 2022-04-05: 30 mL via PERINEURAL

## 2022-04-05 MED ORDER — DEXAMETHASONE SODIUM PHOSPHATE 10 MG/ML IJ SOLN
INTRAMUSCULAR | Status: DC | PRN
  Administered 2022-04-05: 10 mg

## 2022-04-05 MED ORDER — MIDAZOLAM HCL 2 MG/2ML IJ SOLN: 2.0000 mg | Freq: Once | INTRAMUSCULAR | Status: AC

## 2022-04-05 MED ORDER — ORAL CARE MOUTH RINSE: 15.0000 mL | Freq: Once | OROMUCOSAL | Status: AC

## 2022-04-05 MED ORDER — PROPOFOL 10 MG/ML IV BOLUS
INTRAVENOUS | Status: DC | PRN
  Administered 2022-04-05: 10 mg via INTRAVENOUS
  Administered 2022-04-05: 50 mg via INTRAVENOUS

## 2022-04-05 MED ORDER — CHLORHEXIDINE GLUCONATE 0.12 % MT SOLN
15.0000 mL | Freq: Once | OROMUCOSAL | Status: AC
  Administered 2022-04-05: 15 mL via OROMUCOSAL
  Filled 2022-04-05: qty 15

## 2022-04-05 MED ORDER — FENTANYL CITRATE (PF) 100 MCG/2ML IJ SOLN
INTRAMUSCULAR | Status: AC
  Administered 2022-04-05: 50 ug via INTRAVENOUS
  Filled 2022-04-05: qty 2

## 2022-04-05 SURGICAL SUPPLY — 65 items
BAG COUNTER SPONGE SURGICOUNT (BAG) ×2 IMPLANT
BLADE LONG MED 31X9 (MISCELLANEOUS) ×2 IMPLANT
BNDG COHESIVE 4X5 TAN STRL (GAUZE/BANDAGES/DRESSINGS) ×2 IMPLANT
BNDG ELASTIC 3INX 5YD STR LF (GAUZE/BANDAGES/DRESSINGS) ×2 IMPLANT
BNDG ELASTIC 4X5.8 VLCR STR LF (GAUZE/BANDAGES/DRESSINGS) ×2 IMPLANT
BNDG ESMARK 4X9 LF (GAUZE/BANDAGES/DRESSINGS) ×2 IMPLANT
BNDG GAUZE DERMACEA FLUFF 4 (GAUZE/BANDAGES/DRESSINGS) ×6 IMPLANT
CORD BIPOLAR FORCEPS 12FT (ELECTRODE) ×2 IMPLANT
COVER MAYO STAND STRL (DRAPES) ×2 IMPLANT
COVER SURGICAL LIGHT HANDLE (MISCELLANEOUS) ×2 IMPLANT
CUFF TOURN SGL QUICK 18X4 (TOURNIQUET CUFF) ×2 IMPLANT
CUFF TOURN SGL QUICK 24 (TOURNIQUET CUFF)
CUFF TRNQT CYL 24X4X16.5-23 (TOURNIQUET CUFF) IMPLANT
DRAPE INCISE IOBAN 66X45 STRL (DRAPES) ×2 IMPLANT
DRAPE OEC MINIVIEW 54X84 (DRAPES) IMPLANT
GAUZE SPONGE 4X4 12PLY STRL (GAUZE/BANDAGES/DRESSINGS) ×2 IMPLANT
GAUZE XEROFORM 1X8 LF (GAUZE/BANDAGES/DRESSINGS) ×2 IMPLANT
GAUZE XEROFORM 5X9 LF (GAUZE/BANDAGES/DRESSINGS) IMPLANT
GLOVE BIOGEL M 8.0 STRL (GLOVE) ×2 IMPLANT
GLOVE SS BIOGEL STRL SZ 8 (GLOVE) ×2 IMPLANT
GOWN STRL REUS W/ TWL LRG LVL3 (GOWN DISPOSABLE) ×4 IMPLANT
GOWN STRL REUS W/ TWL XL LVL3 (GOWN DISPOSABLE) ×6 IMPLANT
GOWN STRL REUS W/TWL LRG LVL3 (GOWN DISPOSABLE) ×2
GOWN STRL REUS W/TWL XL LVL3 (GOWN DISPOSABLE) ×2
HEAD RADIAL 10X20 (Head) IMPLANT
IV CATH AUTO 14GX1.75 SAFE ORG (IV SOLUTION) IMPLANT
K-WIRE DBL TROCAR .062X4 (WIRE) ×4
KIT BASIN OR (CUSTOM PROCEDURE TRAY) ×2 IMPLANT
KIT TURNOVER KIT B (KITS) ×2 IMPLANT
KWIRE DBL TROCAR .062X4 (WIRE) IMPLANT
LOOP VASCLR MAXI BLUE 18IN ST (MISCELLANEOUS) IMPLANT
LOOP VASCULAR MAXI 18 BLUE (MISCELLANEOUS)
LOOPS VASCLR MAXI BLUE 18IN ST (MISCELLANEOUS) IMPLANT
MANIFOLD NEPTUNE II (INSTRUMENTS) ×2 IMPLANT
NDL HYPO 25GX1X1/2 BEV (NEEDLE) IMPLANT
NEEDLE HYPO 25GX1X1/2 BEV (NEEDLE) IMPLANT
NS IRRIG 1000ML POUR BTL (IV SOLUTION) ×2 IMPLANT
PACK ORTHO EXTREMITY (CUSTOM PROCEDURE TRAY) ×2 IMPLANT
PAD ARMBOARD 7.5X6 YLW CONV (MISCELLANEOUS) ×4 IMPLANT
PAD CAST 4YDX4 CTTN HI CHSV (CAST SUPPLIES) ×4 IMPLANT
PADDING CAST COTTON 4X4 STRL (CAST SUPPLIES) ×6
PASSER SUT SWANSON 36MM LOOP (INSTRUMENTS) IMPLANT
SLING ARM FOAM STRAP LRG (SOFTGOODS) IMPLANT
SOL PREP POV-IOD 4OZ 10% (MISCELLANEOUS) ×6 IMPLANT
SPECIMEN JAR SMALL (MISCELLANEOUS) ×2 IMPLANT
SPLINT FIBERGLASS 4X30 (CAST SUPPLIES) IMPLANT
STEM IMPLANT W SCREW (Stem) IMPLANT
STOCKINETTE 6  STRL (DRAPES) ×2
STOCKINETTE 6 STRL (DRAPES) IMPLANT
STOCKINETTE SYNTHETIC 4 NONSTR (MISCELLANEOUS) IMPLANT
SUT PROLENE 3 0 PS 2 (SUTURE) IMPLANT
SUT PROLENE 4 0 PS 2 18 (SUTURE) IMPLANT
SUT TIGERTAPE CERCLAGE TGRLNK (SUTURE) ×4
SUT VIC AB 2-0 CT1 27 (SUTURE)
SUT VIC AB 2-0 CT1 TAPERPNT 27 (SUTURE) IMPLANT
SUT VIC AB 3-0 FS2 27 (SUTURE) ×2 IMPLANT
SUT VICRYL 0 UR6 27IN ABS (SUTURE) IMPLANT
SUTURE TIGERTAPE CERCLG TGRLNK (SUTURE) IMPLANT
SYR CONTROL 10ML LL (SYRINGE) IMPLANT
TOWEL GREEN STERILE (TOWEL DISPOSABLE) ×4 IMPLANT
TOWEL GREEN STERILE FF (TOWEL DISPOSABLE) ×2 IMPLANT
TUBE CONNECTING 12X1/4 (SUCTIONS) IMPLANT
UNDERPAD 30X36 HEAVY ABSORB (UNDERPADS AND DIAPERS) ×2 IMPLANT
VASCULAR TIE MAXI BLUE 18IN ST (MISCELLANEOUS)
WATER STERILE IRR 1000ML POUR (IV SOLUTION) ×2 IMPLANT

## 2022-04-05 NOTE — Anesthesia Procedure Notes (Signed)
Procedure Name: MAC Date/Time: 04/05/2022 4:03 PM  Performed by: Maude Leriche, CRNAOxygen Delivery Method: Simple face mask Placement Confirmation: positive ETCO2 Dental Injury: Teeth and Oropharynx as per pre-operative assessment

## 2022-04-05 NOTE — Progress Notes (Signed)
Dr. Gloris Manchester stated it was okay for IV to be in left arm.

## 2022-04-05 NOTE — Transfer of Care (Signed)
Immediate Anesthesia Transfer of Care Note  Patient: KAMEAH THRESHER  Procedure(s) Performed: OPEN REDUCTION INTERNAL FIXATION RIGHT OLECRANON FRACTURE (Right: Elbow) RADIAL HEAD ARTHROPLASTY AND REPAIR OF LIGAMENTS AS NECESSARY (Right: Arm Lower)  Patient Location: PACU  Anesthesia Type:MAC combined with regional for post-op pain  Level of Consciousness: awake, alert , and oriented  Airway & Oxygen Therapy: Patient Spontanous Breathing  Post-op Assessment: Report given to RN, Post -op Vital signs reviewed and stable, and Patient able to stick tongue midline  Post vital signs: Reviewed  Last Vitals:  Vitals Value Taken Time  BP 166/74   Temp 97.6   Pulse 79 04/05/22 1816  Resp 24 04/05/22 1816  SpO2 92 % 04/05/22 1816  Vitals shown include unvalidated device data.  Last Pain:  Vitals:   04/05/22 1450  TempSrc:   PainSc: 0-No pain         Complications: No notable events documented.

## 2022-04-05 NOTE — Discharge Instructions (Signed)
Please remember to elevate move your fingers frequently and massage her fingers.  If you have problems you may call the office or if there is an emergency regarding your upper extremity Dr. Vanetta Shawl cell phone is [phone]  We will call for your follow-up appointment.  It has been a pleasure to see you today.  We recommend that you to take vitamin C 1000 mg a day to promote healing. We also recommend that if you require  pain medicine that you take a stool softener to prevent constipation as most pain medicines will have constipation side effects. We recommend either Peri-Colace or Senokot and recommend that you also consider adding MiraLAX as well to prevent the constipation affects from pain medicine if you are required to use them. These medicines are over the counter and may be purchased at a local pharmacy. A cup of yogurt and a probiotic can also be helpful during the recovery process as the medicines can disrupt your intestinal environment.Keep bandage clean and dry.  Call for any problems.  No smoking.  Criteria for driving a car: you should be off your pain medicine for 7-8 hours, able to drive one handed(confident), thinking clearly and feeling able in your judgement to drive. Continue elevation as it will decrease swelling.  If instructed by MD move your fingers within the confines of the bandage/splint.  Use ice if instructed by your MD. Call immediately for any sudden loss of feeling in your hand/arm or change in functional abilities of the extremity.

## 2022-04-05 NOTE — Anesthesia Postprocedure Evaluation (Signed)
Anesthesia Post Note  Patient: JOVANNE HIGGINBOTTOM  Procedure(s) Performed: OPEN REDUCTION INTERNAL FIXATION RIGHT OLECRANON FRACTURE (Right: Elbow) RADIAL HEAD ARTHROPLASTY AND REPAIR OF LIGAMENTS AS NECESSARY (Right: Arm Lower)     Patient location during evaluation: PACU Anesthesia Type: Regional and MAC Level of consciousness: awake and alert Pain management: pain level controlled Vital Signs Assessment: post-procedure vital signs reviewed and stable Respiratory status: spontaneous breathing and respiratory function stable Cardiovascular status: stable Postop Assessment: no apparent nausea or vomiting Anesthetic complications: no   No notable events documented.  Last Vitals:  Vitals:   04/05/22 1830 04/05/22 1845  BP: (!) 164/68 (!) 157/75  Pulse: 73 72  Resp: 12 12  Temp:  36.8 C  SpO2: 95% 94%    Last Pain:  Vitals:   04/05/22 1845  TempSrc:   PainSc: 0-No pain                 Alona Danford DANIEL

## 2022-04-05 NOTE — Anesthesia Procedure Notes (Signed)
Anesthesia Regional Block: Supraclavicular block   Pre-Anesthetic Checklist: , timeout performed,  Correct Patient, Correct Site, Correct Laterality,  Correct Procedure, Correct Position, site marked,  Risks and benefits discussed,  Surgical consent,  Pre-op evaluation,  At surgeon's request and post-op pain management  Laterality: Right  Prep: Dura Prep       Needles:  Injection technique: Single-shot  Needle Type: Echogenic Stimulator Needle     Needle Length: 5cm  Needle Gauge: 20     Additional Needles:   Procedures:,,,, ultrasound used (permanent image in chart),,    Narrative:  Start time: 04/05/2022 2:32 PM End time: 04/05/2022 2:36 PM Injection made incrementally with aspirations every 5 mL.  Performed by: Personally  Anesthesiologist: Darral Dash, DO  Additional Notes: Patient identified. Risks/Benefits/Options discussed with patient including but not limited to bleeding, infection, nerve damage, failed block, incomplete pain control. Patient expressed understanding and wished to proceed. All questions were answered. Sterile technique was used throughout the entire procedure. Please see nursing notes for vital signs. Aspirated in 5cc intervals with injection for negative confirmation. Patient was given instructions on fall risk and not to get out of bed. All questions and concerns addressed with instructions to call with any issues or inadequate analgesia.

## 2022-04-05 NOTE — Op Note (Signed)
Operative note April 05, 2022.  Roseanne Kaufman, MD.  Date of dictation and operation April 05, 2022.  Preoperative diagnosis #1 displaced comminuted radial head fracture right elbow.  #2 displaced comminuted fracture right olecranon with resultant instability about the elbow.  Postop diagnosis: The same.  Operative procedure: #1 open reduction internal fixation right elbow olecranon fracture with tension band and Arthrex suture tape tension construct.  #2 radial head arthroplasty with a Biomet radial head 20 mm with a #6 size stem.  #3 lateral ulnar collateral ligament repair right elbow #4 stress radiography right elbow.  Surgeon Roseanne Kaufman.  Anesthesia Block with IV sedation.  Tourniquet time less than 2 hours.  Estimated blood loss minimal.  Complications none.  Description of procedure: This patient is a 72 year old female who fell yesterday sustaining a comminuted complex fracture dislocation to the elbow with a completely extruded radial head.  She was counseled and we plan to proceed with surgical intervention.  She was given a block in the preop holding and counseled.  She was then taken to the operative theater and underwent a smooth induction of IV sedation.  She was placed on a sloppy lateral with all body parts well-padded and SCD hose placed.  Following this she was prepped with Hibiclens scrub followed by Betadine scrub and paint.  Once this was complete timeout was observed and sterile field was secured.  I made a utilitarian posterior incision about the elbow.  Skin flaps were elevated nicely.  The olecranon fracture was exposed.  I swept over to the radial head region where a comminuted complex radial head fracture was noted.  I made a very careful interval volar/on the posterior side of the elbow between the anconeus and the extensor apparatus.  I very carefully dissected down and remove the extruded radial head.  Following this I then sized the radial head.  It was a size  20 radial head.  I made sure not to size up but to size down based upon the guides.  Following this I then prepared the shaft and placed a planer device followed by broaching to a size 6 which fit best.  Following this I trialed the patient and then implanted after copious irrigation the Biomet size 6 stem and 20 mm implant.  There were no complicating features.  Once this was complete I then took radiographs.  Multiple radiographs were taken.  This time I repaired the annular ligament and the lateral ulnar collateral ligament with deep suture material.  The patient tolerated this well.  This was lateral and the collateral ligament repair all looked well I was very pleased with the stability overall.  At this time I turned attention towards the olecranon and debrided the fracture.  Following fracture debridement I placed two 1.6 Kirschner wires from the tip of the olecranon to exit the anterior cortex.  I then placed the Arthrex suture tape in 2 different areas to allow for 2 separate suture tape/labral tape strands of material in a figure-of-eight fashion to harness the tension band wire and figure-of-eight construct.  Once this was completed attention to approximately 60 on both limbs and then throughout half hitch pretensioned and was very pleased with coaptation of the surfaces against compression.  I pre-bent the wires and seated them I was quite pleased with the position.  There were no complicating features.  The wires were seated underneath the triceps.  There were no complicating features.  Thus a olecranon ORIF as well as radial head arthroplasty  and ligamentous repair was performed.  Final x-rays were taken and the patient had excellent stability and no complicating features.  Moving forward we will see her back in 14 days elevate move massage fingers.  At 14 days will begin well arm assisted range of motion from 50 degrees to full flexion at 4 weeks we will go down to 30 degrees and at 6  weeks 15 degrees.  She had a very stable construct thus some of the move for quickly.  I should note the wounds were irrigated copiously and closed with Vicryl about the fascial regions and fascia was oversewn about the labral tape knots.  The skin edge was closed with Prolene.  Long-arm splint was applied and there were no complicating features.  Patient had soft compartments and excellent refill to the hand.  She will be going home tonight and follow-up in 14 days and has my cell phone for any problems  Roseanne Kaufman, MD

## 2022-04-05 NOTE — H&P (Signed)
Sheila Ramsey is an 72 y.o. female.   Chief Complaint: Fracture dislocation right elbow HPI: The patient presents with a complicated fracture to the right upper extremity she has a fracture to the radial head displaced and dislocated.  In addition to this the patient has an olecranon fracture and soft tissue injury.  I discussed these issues with the patient at length.  Will plan to proceed with open reduction and repair of the olecranon fracture and radial head arthroplasty and ligamentous reconstruction is necessary.  Patient understands all issues plans and concerns and we will proceed accordingly.    Past Medical History:  Diagnosis Date  . Arthritis    "hips, knees" (12/10/2013)  . Cataract   . Diverticular disease   . Heart murmur   . High cholesterol   . Hypertension   . Kidney stones   . Macular degeneration   . OSA on CPAP   . PONV (postoperative nausea and vomiting)   . Sleep apnea     Past Surgical History:  Procedure Laterality Date  . EXTRACORPOREAL SHOCK WAVE LITHOTRIPSY Left    had stent placed and then removed prior to lith  . EYE SURGERY    . JOINT REPLACEMENT    . KNEE ARTHROSCOPY Left 2014  . TONSILLECTOMY    . TOTAL HIP ARTHROPLASTY Right 09/27/2013   Procedure: RIGHT TOTAL HIP ARTHROPLASTY ANTERIOR APPROACH;  Surgeon: Gearlean Alf, MD;  Location: [place];  Service: Orthopedics;  Laterality: Right;  . TOTAL HIP ARTHROPLASTY Left 12/10/2013   Procedure: LEFT TOTAL HIP ARTHROPLASTY ANTERIOR APPROACH;  Surgeon: Gearlean Alf, MD;  Location: [place];  Service: Orthopedics;  Laterality: Left;    Family History  Problem Relation Age of Onset  . Dementia Mother        NPH, Lewey Body Dementia  . Hypertension Mother   . HIV/AIDS Brother   . Other Maternal Grandmother        nasal cancer, snuff  . Stroke Maternal Grandfather   . Early death Paternal Grandmother    Social History:  reports that she has quit smoking. Her smoking use included cigarettes. She  has never used smokeless tobacco. She reports current alcohol use. She reports that she does not use drugs.  Allergies:  Allergies  Allergen Reactions  . Hydrocodone Nausea Only  . Oxycodone Nausea Only    Medications Prior to Admission  Medication Sig Dispense Refill  . amLODipine (NORVASC) 10 MG tablet Take by mouth.    . Cholecalciferol (VITAMIN D3 PO) Take 5,000 Units by mouth.    . Coenzyme Q10 (COQ10) 100 MG CAPS Take by mouth.    . losartan (COZAAR) 100 MG tablet losartan 100 mg tablet    . MAGNESIUM PO Take by mouth.    . metoprolol tartrate (LOPRESSOR) 25 MG tablet Take 25 mg by mouth daily.    . Multiple Vitamins-Minerals (MULTIVITAMIN ADULTS PO) Take by mouth.    . Multiple Vitamins-Minerals (PRESERVISION AREDS PO) Take by mouth.    . rosuvastatin (CRESTOR) 40 MG tablet Take 1 tablet (40 mg total) by mouth daily. 90 tablet 3  . Zolpidem Tartrate (AMBIEN PO) Take 10 mg by mouth. Take 1/4 tablet by mouth at night       No results found for this or any previous visit (from the past 48 hour(s)). No results found.  Review of Systems  Respiratory: Negative.    Cardiovascular: Negative.   Gastrointestinal: Negative.   Endocrine: Negative.   Genitourinary: Negative.  There were no vitals taken for this visit. Physical Exam  Fracture dislocation right elbow.  Shoulder and wrist are stable.  She is intact to sensation and motor function.  She has some tingling in the hand owing to the injury but no evidence of advanced compartment syndrome or advanced neural compression at this time.  I reviewed all issues with her at length.   At present juncture it is difficult to get a comprehensive exam due to the pain but she is able to demonstrate no compartment syndrome or other issues.  The patient is alert and oriented in no acute distress. The patient complains of pain in the affected upper extremity.  The patient is noted to have a normal HEENT exam. Lung fields show equal  chest expansion and no shortness of breath. Abdomen exam is nontender without distention. Lower extremity examination does not show any fracture dislocation or blood clot symptoms. Pelvis is stable and the neck and back are stable and nontender.  Assessment/Plan We will plan to proceed with open reduction internal fixation olecranon fracture and radial head arthroplasty with repair and soft tissue reconstruction right elbow.  All questions have been addressed.  I discussed with patient meaningful expectations and other issues as germane to the upper extremity predicament.  With this in mind we will move forward accordingly.  All questions have been encouraged and answered.  We are planning surgery for your upper extremity. The risk and benefits of surgery to include risk of bleeding, infection, anesthesia,  damage to normal structures and failure of the surgery to accomplish its intended goals of relieving symptoms and restoring function have been discussed in detail. With this in mind we plan to proceed. I have specifically discussed with the patient the pre-and postoperative regime and the dos and don'ts and risk and benefits in great detail. Risk and benefits of surgery also include risk of dystrophy(CRPS), chronic nerve pain, failure of the healing process to go onto completion and other inherent risks of surgery The relavent the pathophysiology of the disease/injury process, as well as the alternatives for treatment and postoperative course of action has been discussed in great detail with the patient who desires to proceed.  We will do everything in our power to help you (the patient) restore function to the upper extremity. It is a pleasure to see this patient today.   Willa Frater III, MD 04/05/2022, 12:37 PM

## 2022-04-05 NOTE — Anesthesia Preprocedure Evaluation (Addendum)
Anesthesia Evaluation  Patient identified by MRN, date of birth, ID band Patient awake    Reviewed: Allergy & Precautions, NPO status , Patient's Chart, lab work & pertinent test results  History of Anesthesia Complications (+) PONV and history of anesthetic complications  Airway Mallampati: II  TM Distance: >3 FB Neck ROM: Full    Dental no notable dental hx.    Pulmonary sleep apnea and Continuous Positive Airway Pressure Ventilation , Patient abstained from smoking., former smoker   Pulmonary exam normal        Cardiovascular hypertension, Pt. on medications and Pt. on home beta blockers  Rhythm:Regular Rate:Normal     Neuro/Psych negative neurological ROS  negative psych ROS   GI/Hepatic negative GI ROS, Neg liver ROS,,,  Endo/Other  diabetes, Type 2, Oral Hypoglycemic Agents    Renal/GU   negative genitourinary   Musculoskeletal  (+) Arthritis ,  Right elbow fracture   Abdominal Normal abdominal exam  (+)   Peds  Hematology negative hematology ROS (+)   Anesthesia Other Findings   Reproductive/Obstetrics                             Anesthesia Physical Anesthesia Plan  ASA: 2  Anesthesia Plan: Regional and MAC   Post-op Pain Management: Regional block*   Induction: Intravenous  PONV Risk Score and Plan: 3 and Ondansetron, Dexamethasone, Propofol infusion, Treatment may vary due to age or medical condition and Midazolam  Airway Management Planned: Simple Face Mask and Nasal Cannula  Additional Equipment: None  Intra-op Plan:   Post-operative Plan:   Informed Consent: I have reviewed the patients History and Physical, chart, labs and discussed the procedure including the risks, benefits and alternatives for the proposed anesthesia with the patient or authorized representative who has indicated his/her understanding and acceptance.     Dental advisory given  Plan  Discussed with:   Anesthesia Plan Comments:        Anesthesia Quick Evaluation

## 2022-04-06 ENCOUNTER — Encounter (HOSPITAL_COMMUNITY): Payer: Self-pay | Admitting: Orthopedic Surgery

## 2022-04-07 ENCOUNTER — Other Ambulatory Visit: Payer: Self-pay
